# Patient Record
Sex: Male | Born: 1965 | Race: White | Hispanic: No | Marital: Single | Smoking: Never smoker
Health system: Southern US, Community
[De-identification: ages and names within clinical notes are randomized; demographics above are authoritative.]

## PROBLEM LIST (undated history)

## (undated) DIAGNOSIS — J189 Pneumonia, unspecified organism: Secondary | ICD-10-CM

## (undated) DIAGNOSIS — E7800 Pure hypercholesterolemia, unspecified: Secondary | ICD-10-CM

## (undated) DIAGNOSIS — U071 COVID-19: Secondary | ICD-10-CM

## (undated) HISTORY — PX: APPENDECTOMY: SHX54

## (undated) HISTORY — PX: KNEE ARTHROSCOPY: SHX127

---

## 1999-11-21 ENCOUNTER — Encounter: Payer: Self-pay | Admitting: Family Medicine

## 1999-11-21 ENCOUNTER — Encounter: Admission: RE | Admit: 1999-11-21 | Discharge: 1999-11-21 | Payer: Self-pay | Admitting: Family Medicine

## 2000-03-16 ENCOUNTER — Encounter: Admission: RE | Admit: 2000-03-16 | Discharge: 2000-03-16 | Payer: Self-pay | Admitting: Gastroenterology

## 2000-03-16 ENCOUNTER — Encounter: Payer: Self-pay | Admitting: Gastroenterology

## 2000-09-08 ENCOUNTER — Emergency Department (HOSPITAL_COMMUNITY): Admission: EM | Admit: 2000-09-08 | Discharge: 2000-09-09 | Payer: Self-pay | Admitting: Emergency Medicine

## 2000-09-09 ENCOUNTER — Encounter: Payer: Self-pay | Admitting: Emergency Medicine

## 2002-07-10 ENCOUNTER — Encounter: Payer: Self-pay | Admitting: Family Medicine

## 2002-07-10 ENCOUNTER — Ambulatory Visit (HOSPITAL_COMMUNITY): Admission: RE | Admit: 2002-07-10 | Discharge: 2002-07-11 | Payer: Self-pay | Admitting: Family Medicine

## 2002-07-10 ENCOUNTER — Encounter (INDEPENDENT_AMBULATORY_CARE_PROVIDER_SITE_OTHER): Payer: Self-pay | Admitting: Specialist

## 2002-11-07 ENCOUNTER — Encounter: Payer: Self-pay | Admitting: Emergency Medicine

## 2002-11-07 ENCOUNTER — Emergency Department (HOSPITAL_COMMUNITY): Admission: EM | Admit: 2002-11-07 | Discharge: 2002-11-07 | Payer: Self-pay | Admitting: Emergency Medicine

## 2006-07-26 ENCOUNTER — Ambulatory Visit: Payer: Self-pay | Admitting: Cardiology

## 2006-09-20 ENCOUNTER — Ambulatory Visit: Payer: Self-pay | Admitting: Cardiology

## 2010-04-24 ENCOUNTER — Emergency Department (HOSPITAL_BASED_OUTPATIENT_CLINIC_OR_DEPARTMENT_OTHER): Admission: EM | Admit: 2010-04-24 | Discharge: 2010-04-24 | Payer: Self-pay | Admitting: Emergency Medicine

## 2011-02-24 NOTE — Op Note (Signed)
NAME:  Marc Horton, Marc Horton                        ACCOUNT NO.:  [id number]   MEDICAL RECORD NO.:  [id number]                   PATIENT TYPE:  OIB   LOCATION:  3005                                 FACILITY:  [facility]   PHYSICIAN:  Ollen Gross. Vernell Morgans, M.D.              DATE OF BIRTH:  1966-06-18   DATE OF PROCEDURE:  07/10/2002  DATE OF DISCHARGE:  07/11/2002                                 OPERATIVE REPORT   PREOPERATIVE DIAGNOSIS:  Acute appendicitis.   POSTOPERATIVE DIAGNOSIS:  Acute appendicitis.   PROCEDURE:  Laparoscopic appendectomy.   SURGEON:  Ollen Gross. Carolynne Edouard, M.D.   ANESTHESIA:  General endotracheal.   DESCRIPTION OF PROCEDURE:  After informed consent was obtained, the patient  was brought to the operating room and placed in the supine position on the  operating table, where after adequate induction of general anesthesia the  patient's abdomen was prepped with Betadine and draped in the usual sterile  manner.  The area below the umbilicus was infiltrated with 0.25% Marcaine  and a small incision was made with a 15 blade knife.  This incision was  carried down through the subcutaneous tissue using blunt dissection with the  Army[employer]y retractors and a Kelly clamp until the linea alba was identified.  The linea alba was incised with a 15 blade knife and each side was grasped  with Kocher clamps and elevated anteriorly.  The preperitoneal space was  then probed bluntly with a hemostat until the peritoneum was opened and  access was gained to the abdominal cavity.  A 0 Vicryl pursestring stitch  was then placed in the fascia surrounding this opening.  A Hasson cannula  was then placed through this opening and anchored into place with the  previously-placed Vicryl pursestring stitch.  The abdomen was then  insufflated with carbon dioxide without difficulty and the laparoscope was  placed through the Hasson cannula.  The patient was placed in some slight  Trendelenburg and rotated a  little bit left side down.  The right lower  quadrant was able to be inspected and an inflamed appendix that appeared to  be spiraled was able to be identified.  Next an area in the suprapubic  region was chosen for a 12 mm port, and this area was infiltrated with 0.25%  Marcaine.  A small incision was made with a 15 blade knife and a 12 mm port  was placed bluntly through this incision into the abdominal cavity under  direct vision.  A site was then chosen just above the Hasson cannula and to  the right of midline for placement of a 5 mm port, and this area was  infiltrated with 0.25% Marcaine.  A small stab incision was made with a 15  blade knife and a 5 mm port was placed bluntly through this incision into  the abdominal cavity.  A blunt grasper was placed through the 12 mm port  and  used to elevate the appendix.  The mesoappendix was then able to be taken  down by a combination of some blunt dissection and division with the  electrocautery.  This was carried out until the appendix was freed up and  the base of the appendix at its junction with the cecum was able to be  identified.  Once this had been cleared of any of the mesoappendix by the  Harmonic scalpel, a Glassman retractor was used to grasp the tip of the  appendix and hold it up in the air.  An endoscopic GIA stapler was then used  to clamp the base of the appendix near its junction with the cecum.  The  endoscopic stapler was fired and the appendix was divided, the distal end  separated.  An endoscopic bag was placed in the abdomen and the appendix was  placed within the bag.  The bag was then removed through the infraumbilical  port with the Hasson cannula.  The Hasson cannula was then replaced and the  abdomen was then inspected.  The staple line looked good and was intact and  was completely hemostatic.  The abdomen was then irrigated with copious  amounts of saline until the effluent was clear.  The ports were then all   removed under direct vision.  The infraumbilical port was closed with the  previously-placed Vicryl pursestring stitch.  The suprapubic port site was  closed with a single interrupted 0 Vicryl stitch.  The skin incisions were  then  closed with interrupted 4-0 nylon subcuticular stitches, Benzoin and Steri-  Strips and sterile dressings were applied.  The patient tolerated the  procedure well.  At the end of the case all needle, sponge, and instrument  counts were correct.  The patient was awakened and taken to the recovery  room in stable condition.                                               Ollen Gross. Vernell Morgans, M.D.    PST/MEDQ  D:  07/21/2002  T:  07/22/2002  Job:  [id number]
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
## 2011-02-24 NOTE — Procedures (Signed)
[facility]                              EXERCISE TREADMILL   NAME:Marc Horton, Marc Horton                     MRN:          [id number]  DATE:09/20/2006                            DOB:          1966-02-10    INDICATIONS:  Evaluate the patient with multiple cardiovascular risk  factors.   PROCEDURE NOTE:  The patient was exercised using standard Bruce  protocol.  He was able to exercise for 9 minutes which completed stage  III.  He achieved 10.4 mets.  He achieved target heart rate with a peak  of 151 which was 89% of predicted.  The test was terminated because of  shortness of breath and because he had reached his target heart rate.  His maximum blood pressure was 189/79.  There were no ischemic ST-T wave  changes.  There were no arrhythmias.  He had a normal heart rate  recovery.  He had no chest pain.   Negative adequate exercise treadmill test.  He had a moderate exercise  tolerance.  This puts him in the low to moderate risk group for future  cardiovascular events.   PLAN:  Based on the above, I did give him a specific prescription for  exercise and we discussed at great length primary risk reduction.  There  is no evidence for a high-grade obstructive coronary disease.  He will  continue with the risk reduction given his family history and we will  follow closely with Dr. Yehuda Budd.     Rollene Rotunda, MD, [facility]  Electronically Signed    JH/MedQ  DD: 09/20/2006  DT: 09/20/2006  Job #: [id number]   cc:   Tammy R. Collins Scotland, M.D.

## 2011-02-24 NOTE — Letter (Signed)
September 20, 2006     RE:  [name], [name]  MRN:  [id number]  /  DOB:  07/27/1966   Herb Grays, MD  [address]  [place] [address]   Dear Babette Relic,   Thank you for sending [name] to my clinic. As you know this  gentleman has had a dramatic year with his brother having a sudden  myocardial infarction. I understand that he is home and recovering with  some short term memory deficits.  [name] did not have any red flags suggesting high grade obstructive  coronary disease. However, I agree with an aggressive approach for  primary risk reduction given the family history. In this situation I  prefer to do exercise treadmill test on these folks. While I do not  think it is a great screening test for obstructive coronary disease, I  think it is very reasonable when the pretest probability of disease is  low. More importantly I like to use the test to risk stratify patients  and most importantly to give them an exercise regimen. Today I did that  treadmill test. He had a reasonable exercise tolerance, though not  great. He had no evidence of high grade obstructive coronary disease.  Following his heart rate recovery after exercise, I am looking at his  exercise tolerance, I think he is in the low to low-moderate risk  category for future cardiovascular events. He needs no further  cardiovascular testing.  Based on this study I did give him very specific instructions on an  exercise regimen. I greatly agree with an aggressive approach to lipid  management, blood pressure control and weight  loss.  Again thank you for letting me participate in this gentleman's care.  Primary risk reduction is one of my favorite things to do. If you have  any questions about this gentleman please do not hesitate to call me at  [phone] on my cell.    Sincerely,      Rollene Rotunda, MD, [facility]  Electronically Signed    JH/MedQ  DD: 09/20/2006  DT: 09/20/2006  Job #: [id number]

## 2013-12-14 ENCOUNTER — Emergency Department (INDEPENDENT_AMBULATORY_CARE_PROVIDER_SITE_OTHER)
Admission: EM | Admit: 2013-12-14 | Discharge: 2013-12-14 | Disposition: A | Discharge status: Home / Self Care | Payer: PRIVATE HEALTH INSURANCE | Source: Home / Self Care | Attending: Family Medicine | Admitting: Family Medicine

## 2013-12-14 ENCOUNTER — Encounter: Payer: Self-pay | Admitting: Emergency Medicine

## 2013-12-14 DIAGNOSIS — J40 Bronchitis, not specified as acute or chronic: Secondary | ICD-10-CM

## 2013-12-14 DIAGNOSIS — R062 Wheezing: Secondary | ICD-10-CM

## 2013-12-14 DIAGNOSIS — J069 Acute upper respiratory infection, unspecified: Secondary | ICD-10-CM

## 2013-12-14 HISTORY — DX: Pneumonia, unspecified organism: J18.9

## 2013-12-14 MED ORDER — AZITHROMYCIN 250 MG PO TABS: ORAL_TABLET | ORAL | Status: DC

## 2013-12-14 MED ORDER — METHYLPREDNISOLONE ACETATE 80 MG/ML IJ SUSP
80.0000 mg | Freq: Once | INTRAMUSCULAR | Status: AC
  Administered 2013-12-14: 80 mg via INTRAMUSCULAR

## 2013-12-14 MED ORDER — BENZONATATE 100 MG PO CAPS: 100.0000 mg | ORAL_CAPSULE | Freq: Three times a day (TID) | ORAL | Status: DC | PRN

## 2013-12-14 NOTE — ED Notes (Signed)
Reports 2 1/2 week history of congestion and cough; no known fever; throat raw from cough; fatigue and general aches. History of pneumonia, so concerned this may be possibility. No OTCs today.

## 2013-12-14 NOTE — ED Provider Notes (Signed)
CSN: [id number]     Arrival date & time 12/14/13  1623 History   First MD Initiated Contact with Patient 12/14/13 1645     Chief Complaint  Patient presents with  . Cough  . Generalized Body Aches  . Sore Throat  . Nasal Congestion    HPI  URI Symptoms Onset: 2-3 weeks  Description: rhinorrhea, nasal congestion, cough  Modifying factors:  Remote hx/o PNA in the past. Minimal SOB.   Symptoms Nasal discharge: yes Fever: no Sore throat: no Cough: yes Wheezing: mild Ear pain: no GI symptoms: no Sick contacts: no  Red Flags  Stiff neck: no Dyspnea: minimal  Rash: no Swallowing difficulty: no  Sinusitis Risk Factors Headache/face pain: no Double sickening: no tooth pain: no  Allergy Risk Factors Sneezing: no Itchy scratchy throat: no Seasonal symptoms: no  Flu Risk Factors Headache: no muscle aches: no severe fatigue: no   Past Medical History  Diagnosis Date  . Pneumonia    Past Surgical History  Procedure Laterality Date  . Knee arthroscopy Right   . Appendectomy     Family History  Problem Relation Age of Onset  . Heart failure Mother   . Heart failure Father    History  Substance Use Topics  . Smoking status: Never Smoker   . Smokeless tobacco: Not on file  . Alcohol Use: No    Review of Systems  All other systems reviewed and are negative.    Allergies  Review of patient's allergies indicates no known allergies.  Home Medications   Current Outpatient Rx  Name  Route  Sig  Dispense  Refill  . azithromycin (ZITHROMAX) 250 MG tablet      Take 2 tabs PO x 1 dose, then 1 tab PO QD x 4 days   6 tablet   0   . benzonatate (TESSALON) 100 MG capsule   Oral   Take 1-2 capsules (100-200 mg total) by mouth 3 (three) times daily as needed for cough.   40 capsule   0    BP 134/73  Pulse 105  Temp(Src) 98.4 F (36.9 C) (Oral)  Resp 16  Ht 6' (1.829 m)  Wt 245 lb (111.131 kg)  BMI 33.22 kg/m2  SpO2 96% Physical Exam   Constitutional: He is oriented to person, place, and time. He appears well-developed and well-nourished.  HENT:  Head: Normocephalic and atraumatic.  Right Ear: External ear normal.  Left Ear: External ear normal.  +nasal erythema, rhinorrhea bilaterally, + post oropharyngeal erythema    Eyes: Conjunctivae are normal. Pupils are equal, round, and reactive to light.  Neck: Normal range of motion. Neck supple.  Cardiovascular: Normal rate and regular rhythm.   Pulmonary/Chest: Effort normal. He has wheezes.  Abdominal: Soft.  Musculoskeletal: Normal range of motion.  Neurological: He is alert and oriented to person, place, and time.  Skin: Skin is warm.    ED Course  Procedures (including critical care time) Labs Review Labs Reviewed - No data to display Imaging Review No results found.   MDM   1. URI (upper respiratory infection)   2. Bronchitis   3. Wheezing    Likely viral source or sxs  Will place on zpak for lower resp coverage given prior history. Reassuring resp exam today.  Depomedrol 80mg  IM x1 given mild wheezing Tessalon perles for cough  Discussed supportive care and infectious/resp red flags.  Follow up as needed.    The patient and/or caregiver has been counseled thoroughly with  regard to treatment plan and/or medications prescribed including dosage, schedule, interactions, rationale for use, and possible side effects and they verbalize understanding. Diagnoses and expected course of recovery discussed and will return if not improved as expected or if the condition worsens. Patient and/or caregiver verbalized understanding.         Doree Albee, MD 12/14/13 [phone]

## 2015-06-06 ENCOUNTER — Emergency Department (INDEPENDENT_AMBULATORY_CARE_PROVIDER_SITE_OTHER)
Admission: EM | Admit: 2015-06-06 | Discharge: 2015-06-06 | Disposition: A | Discharge status: Home / Self Care | Payer: PRIVATE HEALTH INSURANCE | Source: Home / Self Care | Attending: Family Medicine | Admitting: Family Medicine

## 2015-06-06 DIAGNOSIS — H01006 Unspecified blepharitis left eye, unspecified eyelid: Secondary | ICD-10-CM

## 2015-06-06 DIAGNOSIS — H01003 Unspecified blepharitis right eye, unspecified eyelid: Secondary | ICD-10-CM | POA: Diagnosis not present

## 2015-06-06 HISTORY — DX: Pure hypercholesterolemia, unspecified: E78.00

## 2015-06-06 MED ORDER — ERYTHROMYCIN 5 MG/GM OP OINT: TOPICAL_OINTMENT | OPHTHALMIC | Status: DC

## 2015-06-06 NOTE — ED Notes (Signed)
Pt states that for 3-4 weeks his eye has been crusty when waking, and redness intermittently during the day.  Has been treating with OTC pink eye medication, but not relieving symptoms

## 2015-06-06 NOTE — ED Provider Notes (Signed)
CSN: [id number]     Arrival date & time 06/06/15  1312 History   First MD Initiated Contact with Patient 06/06/15 1404     Chief Complaint  Patient presents with  . Eye Problem    Right      HPI Comments: Patient complains of 3 week history of eye redness each morning primarily in his right eye, and to a mild extent in his left eye.  His eyes crusted with discharge each morning and symptoms improve during the day.  No foreign body.  No change in vision.  His condition has not improved with OTC "pink eye medicine."  Patient is a 49 y.o. male presenting with eye problem. The history is provided by the patient.  Eye Problem Location:  Both Quality: mild itching. Severity:  Mild Onset quality:  Gradual Duration:  3 weeks Timing:  Constant Progression:  Unchanged Chronicity:  New Context: not chemical exposure, not contact lens problem, not direct trauma, not foreign body and not scratch   Relieved by:  Nothing Worsened by:  Nothing tried Ineffective treatments:  Eye drops Associated symptoms: crusting, discharge, inflammation, itching, redness and tearing   Associated symptoms: no blurred vision, no decreased vision, no double vision, no facial rash, no foreign body sensation, no headaches, no photophobia, no scotomas, no swelling and no tingling   Risk factors: no recent URI     Past Medical History  Diagnosis Date  . Hypercholesteremia    History reviewed. No pertinent past surgical history. Family History  Problem Relation Age of Onset  . Diabetes Mother   . Heart disease Mother   . Heart disease Father    Social History  Substance Use Topics  . Smoking status: Never Smoker   . Smokeless tobacco: Never Used  . Alcohol Use: No    Review of Systems  Eyes: Positive for discharge, redness and itching. Negative for blurred vision, double vision and photophobia.  Neurological: Negative for tingling and headaches.    Allergies  Review of patient's allergies indicates no  known allergies.  Home Medications   Prior to Admission medications   Not on File   Meds Ordered and Administered this Visit  Medications - No data to display  BP 137/80 mmHg  Pulse 76  Temp(Src) 98.4 F (36.9 C) (Oral)  Ht 6' (1.829 m)  Wt 260 lb (117.935 kg)  BMI 35.25 kg/m2  SpO2 94% No data found.   Physical Exam  Constitutional: He appears well-developed and well-nourished. No distress.  Patient is obese (BMI 35.3)  HENT:  Head: Normocephalic.  Right Ear: Tympanic membrane, external ear and ear canal normal.  Left Ear: Tympanic membrane, external ear and ear canal normal.  Nose: Nose normal.  Mouth/Throat: Oropharynx is clear and moist.  Eyes: Conjunctivae and EOM are normal. Pupils are equal, round, and reactive to light. Lids are everted and swept, no foreign bodies found. Right eye exhibits chemosis. Right eye exhibits no discharge, no exudate and no hordeolum. No foreign body present in the right eye. Left eye exhibits no chemosis, no discharge, no exudate and no hordeolum. No foreign body present in the left eye.    Right upper eyelid is slightly erythematous along interior edge.  Minimal swelling and tenderness to palpation upper eyelid. Left upper eyelid has mild erythema along interior edge without swelling or tenderness of eyelid.  Neck: Neck supple.  Lymphadenopathy:    He has no cervical adenopathy.  Skin: Skin is warm and dry. No rash noted.  Nursing note and vitals reviewed.   ED Course  Procedures  None    MDM   1. Blepharitis of both eyes (posterior type)   Begin Erythromycin ophthalmic ointment to eyelid margins TID Begin applying a warm compress to eyes for about 10 minutes, 3 to 4 times daily.  Treatment instructions given to patient. Followup with ophthalmologist if not improving about 3 weeks.  Instructions given.    Lattie Haw, MD 06/08/15 [phone]

## 2015-06-06 NOTE — Discharge Instructions (Signed)
Begin applying a warm compress to eyes for about 10 minutes, 3 to 4 times daily.   Blepharitis Blepharitis is redness, soreness, and swelling (inflammation) of one or both eyelids. It may be caused by an allergic reaction or a bacterial infection. Blepharitis may also be associated with reddened, scaly skin (seborrhea) of the scalp and eyebrows. While you sleep, eye discharge may cause your eyelashes to stick together. Your eyelids may itch, burn, swell, and may lose their lashes. These will grow back. Your eyes may become sensitive. Blepharitis may recur and need repeated treatment. If this is the case, you may require further evaluation by an eye specialist (ophthalmologist). HOME CARE INSTRUCTIONS   Keep your hands clean.  Use a clean towel each time you dry your eyelids. Do not use this towel to clean other areas. Do not share a towel or makeup with anyone.  Wash your eyelids with warm water or warm water mixed with a small amount of baby shampoo. Do this twice a day or as often as needed.  Wash your face and eyebrows at least once a day.  Use warm compresses 2 times a day for 10 minutes at a time, or as directed by your caregiver.  Apply antibiotic ointment as directed by your caregiver.  Avoid rubbing your eyes.  Avoid wearing makeup until you get better.  Follow up with your caregiver as directed. SEEK IMMEDIATE MEDICAL CARE IF:   You have pain, redness, or swelling that gets worse or spreads to other parts of your face.  Your vision changes, or you have pain when looking at lights or moving objects.  You have a fever.  Your symptoms continue for longer than 2 to 4 days or become worse. MAKE SURE YOU:   Understand these instructions.  Will watch your condition.  Will get help right away if you are not doing well or get worse. Document Released: 09/22/2000 Document Revised: 12/18/2011 Document Reviewed: 11/02/2010 [facility] Patient Information 2015 [place], [place]. This  information is not intended to replace advice given to you by your health care provider. Make sure you discuss any questions you have with your health care provider.

## 2015-11-11 ENCOUNTER — Encounter: Payer: Self-pay | Admitting: Family Medicine

## 2015-11-11 ENCOUNTER — Ambulatory Visit (INDEPENDENT_AMBULATORY_CARE_PROVIDER_SITE_OTHER): Payer: PRIVATE HEALTH INSURANCE

## 2015-11-11 ENCOUNTER — Ambulatory Visit (INDEPENDENT_AMBULATORY_CARE_PROVIDER_SITE_OTHER): Payer: PRIVATE HEALTH INSURANCE | Admitting: Family Medicine

## 2015-11-11 VITALS — BP 136/82 | HR 96 | Ht 72.0 in | Wt 271.0 lb

## 2015-11-11 DIAGNOSIS — E669 Obesity, unspecified: Secondary | ICD-10-CM

## 2015-11-11 DIAGNOSIS — R059 Cough, unspecified: Secondary | ICD-10-CM

## 2015-11-11 DIAGNOSIS — R030 Elevated blood-pressure reading, without diagnosis of hypertension: Secondary | ICD-10-CM | POA: Diagnosis not present

## 2015-11-11 DIAGNOSIS — R05 Cough: Secondary | ICD-10-CM | POA: Diagnosis not present

## 2015-11-11 DIAGNOSIS — R000 Tachycardia, unspecified: Secondary | ICD-10-CM | POA: Diagnosis not present

## 2015-11-11 DIAGNOSIS — R058 Other specified cough: Secondary | ICD-10-CM | POA: Insufficient documentation

## 2015-11-11 DIAGNOSIS — Z1159 Encounter for screening for other viral diseases: Secondary | ICD-10-CM

## 2015-11-11 DIAGNOSIS — I10 Essential (primary) hypertension: Secondary | ICD-10-CM | POA: Insufficient documentation

## 2015-11-11 DIAGNOSIS — Z114 Encounter for screening for human immunodeficiency virus [HIV]: Secondary | ICD-10-CM

## 2015-11-11 DIAGNOSIS — R6882 Decreased libido: Secondary | ICD-10-CM | POA: Insufficient documentation

## 2015-11-11 DIAGNOSIS — IMO0001 Reserved for inherently not codable concepts without codable children: Secondary | ICD-10-CM

## 2015-11-11 MED ORDER — ALBUTEROL SULFATE 108 (90 BASE) MCG/ACT IN AEPB: 1.0000 | INHALATION_SPRAY | Freq: Four times a day (QID) | RESPIRATORY_TRACT | Status: DC | PRN

## 2015-11-11 MED ORDER — IPRATROPIUM BROMIDE 0.06 % NA SOLN: 2.0000 | Freq: Four times a day (QID) | NASAL | Status: DC

## 2015-11-11 MED ORDER — GUAIFENESIN-CODEINE 100-10 MG/5ML PO SOLN: 5.0000 mL | Freq: Every evening | ORAL | Status: DC | PRN

## 2015-11-11 MED ORDER — AZITHROMYCIN 250 MG PO TABS: 250.0000 mg | ORAL_TABLET | Freq: Every day | ORAL | Status: DC

## 2015-11-11 MED ORDER — BENZONATATE 200 MG PO CAPS: 200.0000 mg | ORAL_CAPSULE | Freq: Three times a day (TID) | ORAL | Status: DC | PRN

## 2015-11-11 MED ORDER — IPRATROPIUM-ALBUTEROL 0.5-2.5 (3) MG/3ML IN SOLN
3.0000 mL | Freq: Once | RESPIRATORY_TRACT | Status: AC
  Administered 2015-11-11: 3 mL via RESPIRATORY_TRACT

## 2015-11-11 NOTE — Assessment & Plan Note (Signed)
Mildly elevated. Recheck in the next visit.

## 2015-11-11 NOTE — Assessment & Plan Note (Signed)
Mildly elevated likely due to over-the-counter cold medicines or possible pneumonia. Check chest x-ray. Recheck in one month. He's

## 2015-11-11 NOTE — Patient Instructions (Signed)
Thank you for coming in today. 1) Take the medicine for cough.  Get xray.  2) Return in 1 month to recheck blood pressure.  3) Get fasting morning labs soon.   Acute Bronchitis Bronchitis is inflammation of the airways that extend from the windpipe into the lungs (bronchi). The inflammation often causes mucus to develop. This leads to a cough, which is the most common symptom of bronchitis.  In acute bronchitis, the condition usually develops suddenly and goes away over time, usually in a couple weeks. Smoking, allergies, and asthma can make bronchitis worse. Repeated episodes of bronchitis may cause further lung problems.  CAUSES Acute bronchitis is most often caused by the same virus that causes a cold. The virus can spread from person to person (contagious) through coughing, sneezing, and touching contaminated objects. SIGNS AND SYMPTOMS   Cough.   Fever.   Coughing up mucus.   Body aches.   Chest congestion.   Chills.   Shortness of breath.   Sore throat.  DIAGNOSIS  Acute bronchitis is usually diagnosed through a physical exam. Your health care provider will also ask you questions about your medical history. Tests, such as chest X-rays, are sometimes done to rule out other conditions.  TREATMENT  Acute bronchitis usually goes away in a couple weeks. Oftentimes, no medical treatment is necessary. Medicines are sometimes given for relief of fever or cough. Antibiotic medicines are usually not needed but may be prescribed in certain situations. In some cases, an inhaler may be recommended to help reduce shortness of breath and control the cough. A cool mist vaporizer may also be used to help thin bronchial secretions and make it easier to clear the chest.  HOME CARE INSTRUCTIONS  Get plenty of rest.   Drink enough fluids to keep your urine clear or pale yellow (unless you have a medical condition that requires fluid restriction). Increasing fluids may help thin your  respiratory secretions (sputum) and reduce chest congestion, and it will prevent dehydration.   Take medicines only as directed by your health care provider.  If you were prescribed an antibiotic medicine, finish it all even if you start to feel better.  Avoid smoking and secondhand smoke. Exposure to cigarette smoke or irritating chemicals will make bronchitis worse. If you are a smoker, consider using nicotine gum or skin patches to help control withdrawal symptoms. Quitting smoking will help your lungs heal faster.   Reduce the chances of another bout of acute bronchitis by washing your hands frequently, avoiding people with cold symptoms, and trying not to touch your hands to your mouth, nose, or eyes.   Keep all follow-up visits as directed by your health care provider.  SEEK MEDICAL CARE IF: Your symptoms do not improve after 1 week of treatment.  SEEK IMMEDIATE MEDICAL CARE IF:  You develop an increased fever or chills.   You have chest pain.   You have severe shortness of breath.  You have bloody sputum.   You develop dehydration.  You faint or repeatedly feel like you are going to pass out.  You develop repeated vomiting.  You develop a severe headache. MAKE SURE YOU:   Understand these instructions.  Will watch your condition.  Will get help right away if you are not doing well or get worse.   This information is not intended to replace advice given to you by your health care provider. Make sure you discuss any questions you have with your health care provider.   Document  Released: 11/02/2004 Document Revised: 10/16/2014 Document Reviewed: 03/18/2013 Elsevier Interactive Patient Education [employer].

## 2015-11-11 NOTE — Assessment & Plan Note (Signed)
Likely bronchitis. Treat with prednisone, azithromycin, codeine cough syrup, Tessalon, Atrovent nasal spray, and albuterol. Recheck sooner if not better. Obtain chest x-ray for concern for pneumonia based on elevated heart rate.

## 2015-11-11 NOTE — Progress Notes (Signed)
       Marc Horton is a 50 y.o. male who presents to [facility]: Primary Care today for status care discussed cough congestion and runny nose. Cough is mildly productive. Patient denies significant wheezing or shortness of breath. No chest pain palpitations fevers or chills. Son has a recent illness. He patient has a history of pneumonia in the past. He's tried over-the-counter medicines which have helped.  Additionally patient notes some overall underlying health concerns such as obesity and low libido. He would like to return for further discussion of these problems in the future.   Past Medical History  Diagnosis Date  . Pneumonia    Past Surgical History  Procedure Laterality Date  . Knee arthroscopy Right   . Appendectomy     Social History  Substance Use Topics  . Smoking status: Never Smoker   . Smokeless tobacco: Not on file  . Alcohol Use: No   family history includes Heart failure in his father and mother.  ROS as above: No vomiting diarrhea chest pain palpitations or loss labs feeling too cold feeling too hot skin changes. Medications: Current Outpatient Prescriptions  Medication Sig Dispense Refill  . Albuterol Sulfate (PROAIR RESPICLICK) 108 (90 Base) MCG/ACT AEPB Inhale 1 puff into the lungs every 6 (six) hours as needed (wheeze cough). 1 each 1  . azithromycin (ZITHROMAX) 250 MG tablet Take 1 tablet (250 mg total) by mouth daily. Take first 2 tablets together, then 1 every day until finished. 6 tablet 0  . benzonatate (TESSALON) 200 MG capsule Take 1 capsule (200 mg total) by mouth 3 (three) times daily as needed for cough. 45 capsule 0  . guaiFENesin-codeine 100-10 MG/5ML syrup Take 5 mLs by mouth at bedtime as needed for cough. 120 mL 0  . ipratropium (ATROVENT) 0.06 % nasal spray Place 2 sprays into both nostrils 4 (four) times daily. 15 mL 1   No current  facility-administered medications for this visit.   No Known Allergies   Exam:  BP 136/82 mmHg  Pulse 96  Ht 6' (1.829 m)  Wt 271 lb (122.925 kg)  BMI 36.75 kg/m2 Gen: Well NAD obese HEENT: EOMI,  MMM posterior pharynx with cobblestoning. Clear nasal discharge. No significant cervical lymphadenopathy Lungs: Normal work of breathing. Coarse breath sounds bilaterally. Heart: Mildly tachycardia at a rate of 105 bpm but regular no MRG Abd: NABS, Soft. Nondistended, Nontender Exts: Brisk capillary refill, warm and well perfused.  Psych: Alert and oriented normal speech thought process and affect.  Patient was given a 2.5/0.5 mg DuoNeb nebulizer treatment and improved.  Chest x-ray pending.  No results found for this or any previous visit (from the past 24 hour(s)). No results found.   Please see individual assessment and plan sections.

## 2015-11-11 NOTE — Assessment & Plan Note (Signed)
Check testosterone. 

## 2015-11-11 NOTE — Progress Notes (Signed)
Quick Note:  CXR was normal. ______

## 2015-11-11 NOTE — Assessment & Plan Note (Signed)
Obtain fasting labs. We'll discuss at the next visit.

## 2015-12-15 ENCOUNTER — Telehealth: Payer: Self-pay | Admitting: Family Medicine

## 2015-12-15 MED ORDER — ZOSTER VACCINE LIVE 19400 UNT/0.65ML ~~LOC~~ SOLR: 0.6500 mL | Freq: Once | SUBCUTANEOUS | Status: DC

## 2015-12-15 NOTE — Telephone Encounter (Signed)
PT wife dx shingles. Pt would like zoster vaccine

## 2016-03-27 ENCOUNTER — Encounter: Payer: Self-pay | Admitting: Family Medicine

## 2016-06-01 ENCOUNTER — Encounter: Payer: Self-pay | Admitting: Family Medicine

## 2016-11-22 ENCOUNTER — Ambulatory Visit (INDEPENDENT_AMBULATORY_CARE_PROVIDER_SITE_OTHER): Payer: PRIVATE HEALTH INSURANCE

## 2016-11-22 ENCOUNTER — Ambulatory Visit (INDEPENDENT_AMBULATORY_CARE_PROVIDER_SITE_OTHER): Payer: PRIVATE HEALTH INSURANCE | Admitting: Family Medicine

## 2016-11-22 VITALS — BP 138/90 | HR 74 | Wt 264.0 lb

## 2016-11-22 DIAGNOSIS — M172 Bilateral post-traumatic osteoarthritis of knee: Secondary | ICD-10-CM | POA: Diagnosis not present

## 2016-11-22 DIAGNOSIS — G8929 Other chronic pain: Secondary | ICD-10-CM

## 2016-11-22 DIAGNOSIS — M1710 Unilateral primary osteoarthritis, unspecified knee: Secondary | ICD-10-CM | POA: Insufficient documentation

## 2016-11-22 DIAGNOSIS — M25561 Pain in right knee: Principal | ICD-10-CM

## 2016-11-22 DIAGNOSIS — M25562 Pain in left knee: Secondary | ICD-10-CM | POA: Diagnosis not present

## 2016-11-22 DIAGNOSIS — M179 Osteoarthritis of knee, unspecified: Secondary | ICD-10-CM | POA: Insufficient documentation

## 2016-11-22 DIAGNOSIS — M170 Bilateral primary osteoarthritis of knee: Secondary | ICD-10-CM | POA: Diagnosis not present

## 2016-11-22 MED ORDER — DICLOFENAC SODIUM 1 % TD GEL: 4.0000 g | Freq: Four times a day (QID) | TRANSDERMAL | 11 refills | Status: DC

## 2016-11-22 NOTE — Patient Instructions (Signed)
Thank you for coming in today. Call or go to the ER if you develop a large red swollen joint with extreme pain or oozing puss.  Work on exercise bike, and weight loss.  Return as needed.  Use voltaren gel up to 4 x daily.    Arthritis Introduction Arthritis is a term that is commonly used to refer to joint pain or joint disease. There are more than 100 types of arthritis. What are the causes? The most common cause of this condition is wear and tear of a joint. Other causes include:  Gout.  Inflammation of a joint.  An infection of a joint.  Sprains and other injuries near the joint.  A drug reaction or allergic reaction. In some cases, the cause may not be known. What are the signs or symptoms? The main symptom of this condition is pain in the joint with movement. Other symptoms include:  Redness, swelling, or stiffness at a joint.  Warmth coming from the joint.  Fever.  Overall feeling of illness. How is this diagnosed? This condition may be diagnosed with a physical exam and tests, including:  Blood tests.  Urine tests.  Imaging tests, such as MRI, X-rays, or a CT scan. Sometimes, fluid is removed from a joint for testing. How is this treated? Treatment for this condition may involve:  Treatment of the cause, if it is known.  Rest.  Raising (elevating) the joint.  Applying cold or hot packs to the joint.  Medicines to improve symptoms and reduce inflammation.  Injections of a steroid such as cortisone into the joint to help reduce pain and inflammation. Depending on the cause of your arthritis, you may need to make lifestyle changes to reduce stress on your joint. These changes may include exercising more and losing weight. Follow these instructions at home: Medicines  Take over-the-counter and prescription medicines only as told by your health care provider.  Do not take aspirin to relieve pain if gout is suspected. Activity  Rest your joint if told  by your health care provider. Rest is important when your disease is active and your joint feels painful, swollen, or stiff.  Avoid activities that make the pain worse. It is important to balance activity with rest.  Exercise your joint regularly with range-of-motion exercises as told by your health care provider. Try doing low-impact exercise, such as:  Swimming.  Water aerobics.  Biking.  Walking. Joint Care   If your joint is swollen, keep it elevated if told by your health care provider.  If your joint feels stiff in the morning, try taking a warm shower.  If directed, apply heat to the joint. If you have diabetes, do not apply heat without permission from your health care provider.  Put a towel between the joint and the hot pack or heating pad.  Leave the heat on the area for 20-30 minutes.  If directed, apply ice to the joint:  Put ice in a plastic bag.  Place a towel between your skin and the bag.  Leave the ice on for 20 minutes, 2-3 times per day.  Keep all follow-up visits as told by your health care provider. This is important. Contact a health care provider if:  The pain gets worse.  You have a fever. Get help right away if:  You develop severe joint pain, swelling, or redness.  Many joints become painful and swollen.  You develop severe back pain.  You develop severe weakness in your leg.  You  cannot control your bladder or bowels. This information is not intended to replace advice given to you by your health care provider. Make sure you discuss any questions you have with your health care provider. Document Released: 11/02/2004 Document Revised: 03/02/2016 Document Reviewed: 12/21/2014  2017 Elsevier

## 2016-11-22 NOTE — Progress Notes (Signed)
Marc Horton is a 51 y.o. male who presents to [facility] today for knee pain.  He has a history of bilateral knee arthritis for about 20 years that has worsened in the past few months, right worse than left. He has bilateral knee pain that is worse when he stands up from a seated position or goes down stairs. He can hear his right knee popping frequently but denies any mechanical catching or locking. Has taken some ibuprofen and Tylenol intermittently with no relief. Never tried physical therapy, injections, or other medications. 20 years ago, he injured his right knee twisting it at work, had an arthroscopy, and was told that he would need knee replacements before age 3. He would like to delay surgical intervention if possible.  Past Medical History:  Diagnosis Date  . Hypercholesteremia   . Pneumonia    Past Surgical History:  Procedure Laterality Date  . APPENDECTOMY    . KNEE ARTHROSCOPY Right    Social History  Substance Use Topics  . Smoking status: Never Smoker  . Smokeless tobacco: Not on file  . Alcohol use No     ROS:  As above   Medications: No current outpatient prescriptions on file.   No current facility-administered medications for this visit.    No Known Allergies   Exam:  BP 138/90   Pulse 74   Wt 264 lb (119.7 kg)   BMI 35.80 kg/m  General: Well Developed, well nourished, and in no acute distress.  Neuro/Psych: Alert and oriented x3, extra-ocular muscles intact, able to move all 4 extremities, sensation grossly intact. Skin: Warm and dry, no rashes noted.  Respiratory: Not using accessory muscles, speaking in full sentences, trachea midline.  Cardiovascular: Pulses palpable, no extremity edema. Abdomen: Does not appear distended. MSK: Bilateral knees diffusely tender to palpation (R>L) with crepitus on extension. No ligamentous instability and negative McMurray's tests bilaterally. ROM markedly  limited in both knees (worse on right).  X-ray right knee 11-22-16: IMPRESSION: Severe tricompartment degenerative change. Loose bodies may be present. Small knee joint effusion cannot be excluded. No acute bony abnormality identified.  X-ray left knee 11-22-16: IMPRESSION: Severe tricompartment degenerative change with loose bodies. Small knee joint effusion. No acute bony abnormality.   Procedure: Real-time Ultrasound Guided Injection of right knee  Device: GE Logiq E  Images permanently stored and available for review in the ultrasound unit. Verbal informed consent obtained. Discussed risks and benefits of procedure. Warned about infection bleeding damage to structures skin hypopigmentation and fat atrophy among others. Patient expresses understanding and agreement Time-out conducted.  Noted no overlying erythema, induration, or other signs of local infection.  Skin prepped in a sterile fashion.  Local anesthesia: Topical Ethyl chloride.  With sterile technique and under real time ultrasound guidance: 80 mg of Kenalog and 4 mL of Marcaine injected easily.  Completed without difficulty  Pain immediately resolved suggesting accurate placement of the medication.  Advised to call if fevers/chills, erythema, induration, drainage, or persistent bleeding.  Images permanently stored and available for review in the ultrasound unit.  Impression: Technically successful ultrasound guided injection.  Procedure: Real-time Ultrasound Guided Injection of left knee  Device: GE Logiq E  Images permanently stored and available for review in the ultrasound unit. Verbal informed consent obtained. Discussed risks and benefits of procedure. Warned about infection bleeding damage to structures skin hypopigmentation and fat atrophy among others. Patient expresses understanding and agreement Time-out conducted.  Noted no overlying erythema,  induration, or other signs of local infection.   Skin prepped in a sterile fashion.  Local anesthesia: Topical Ethyl chloride.  With sterile technique and under real time ultrasound guidance: 80 mg of Kenalog and 4 mL of Marcaine injected easily.  Completed without difficulty  Pain immediately resolved suggesting accurate placement of the medication.  Advised to call if fevers/chills, erythema, induration, drainage, or persistent bleeding.  Images permanently stored and available for review in the ultrasound unit.  Impression: Technically successful ultrasound guided injection.    No results found for this or any previous visit (from the past 48 hour(s)). No results found.  Assessment and Plan: 51 y.o. male with bilateral knee arthritis. Bilateral steroid injections today. Advised weight loss & quad strengthening (exercise bike), voltaren gel. F/u as needed.   Orders Placed This Encounter  Procedures  . DG Knee Complete 4 Views Left    Please include patellar sunrise, lateral, and weightbearing bilateral AP and bilateral rosenberg views    Standing Status:   Future    Number of Occurrences:   1    Standing Expiration Date:   01/22/2018    Order Specific Question:   Reason for exam:    Answer:   Please include patellar sunrise, lateral, and weightbearing bilateral AP and bilateral rosenberg views    Comments:   Please include patellar sunrise, lateral, and weightbearing bilateral AP and bilateral rosenberg views    Order Specific Question:   Preferred imaging location?    Answer:   [facility]  . DG Knee Complete 4 Views Right    Please include patellar sunrise, lateral, and weightbearing bilateral AP and bilateral rosenberg views    Standing Status:   Future    Number of Occurrences:   1    Standing Expiration Date:   01/22/2018    Order Specific Question:   Reason for exam:    Answer:   Please include patellar sunrise, lateral, and weightbearing bilateral AP and bilateral rosenberg views    Comments:    Please include patellar sunrise, lateral, and weightbearing bilateral AP and bilateral rosenberg views    Order Specific Question:   Preferred imaging location?    Answer:   [facility]    Discussed warning signs or symptoms. Please see discharge instructions. Patient expresses understanding.

## 2017-02-22 ENCOUNTER — Ambulatory Visit (INDEPENDENT_AMBULATORY_CARE_PROVIDER_SITE_OTHER): Payer: PRIVATE HEALTH INSURANCE | Admitting: Family Medicine

## 2017-02-22 VITALS — BP 149/77 | HR 78 | Temp 98.3°F | Wt 261.0 lb

## 2017-02-22 DIAGNOSIS — M533 Sacrococcygeal disorders, not elsewhere classified: Secondary | ICD-10-CM

## 2017-02-22 MED ORDER — DOXYCYCLINE HYCLATE 100 MG PO TABS: 100.0000 mg | ORAL_TABLET | Freq: Two times a day (BID) | ORAL | 0 refills | Status: DC

## 2017-02-22 NOTE — Progress Notes (Signed)
Pt has a cyst on right buttock.  Stated that he noticed it about 5 days ago. According to pt it is hard, denied drainage, redness or heat.

## 2017-02-22 NOTE — Patient Instructions (Signed)
Thank you for coming in today. We will try doxycycline.  Try to get the weight off the bottom especially with driving.  Use a cushion with a button in the middle or a doughnut cushion.  Recheck if not better.    Tailbone Injury The tailbone is the small bone at the lower end of the backbone (spine). You may have stretched tissues, bruises, or a broken bone (fracture). These injuries can be painful. Most tailbone injuries get better on their own in 4-6 weeks. Follow these instructions at home:  Take medicines only as told by your doctor.  If told, apply ice to the injured area.  Put ice in a plastic bag.  Place a towel between your skin and the bag.  Leave the ice on for 20 minutes, 2-3 times per day. Do this for the first 1-2 days.  Sit on a large, rubber or inflated ring or cushion to lessen pain. Lean forward when you sit to help lessen pain.  Avoid sitting in one place for a long time.  Increase your activity as the pain allows.  Do exercises as told by your doctor or physical therapist.  If it is painful to poop, take medicine to help you poop (stool softeners) as told by your doctor.  Eat foods that have plenty of fiber.  Keep all follow-up visits as told by your doctor. This is important. Contact a doctor if:  Your pain gets worse.  Pooping causes you pain.  You cannot poop (constipation).  You are leaking pee (urinary incontinence).  You have a fever. This information is not intended to replace advice given to you by your health care provider. Make sure you discuss any questions you have with your health care provider. Document Released: 10/28/2010 Document Revised: 05/25/2016 Document Reviewed: 09/21/2014 Elsevier Interactive Patient Education  2017 [employer]   Pilonidal Cyst A pilonidal cyst is a fluid-filled sac. It forms beneath the skin near your tailbone, at the top of the crease of your buttocks. A pilonidal cyst that is not large or infected may  not cause symptoms or problems. If the cyst becomes irritated or infected, it may fill with pus. This causes pain and swelling (pilonidal abscess). An infected cyst may need to be treated with medicine, drained, or removed. What are the causes? The cause of a pilonidal cyst is not known. One cause may be a hair that grows into your skin (ingrown hair). What increases the risk? Pilonidal cysts are more common in boys and men. Risk factors include:  Having lots of hair near the crease of the buttocks.  Being overweight.  Having a pilonidal dimple.  Wearing tight clothing.  Not bathing or showering frequently.  Sitting for long periods of time. What are the signs or symptoms? Signs and symptoms of a pilonidal cyst may include:  Redness.  Pain and tenderness.  Warmth.  Swelling.  Pus.  Fever. How is this diagnosed? Your health care provider may diagnose a pilonidal cyst based on your symptoms and a physical exam. The health care provider may do a blood test to check for infection. If your cyst is draining pus, your health care provider may take a sample of the drainage to be tested at a laboratory. How is this treated? Surgery is the usual treatment for an infected pilonidal cyst. You may also have to take medicines before surgery. The type of surgery you have depends on the size and severity of the infected cyst. The different kinds of surgery  include:  Incision and drainage. This is a procedure to open and drain the cyst.  Marsupialization. In this procedure, a large cyst or abscess may be opened and kept open by stitching the edges of the skin to the cyst walls.  Cyst removal. This procedure involves opening the skin and removing all or part of the cyst. Follow these instructions at home:  Follow all of your surgeon's instructions carefully if you had surgery.  Take medicines only as directed by your health care provider.  If you were prescribed an antibiotic medicine,  finish it all even if you start to feel better.  Keep the area around your pilonidal cyst clean and dry.  Clean the area as directed by your health care provider. Pat the area dry with a clean towel. Do not rub it as this may cause bleeding.  Remove hair from the area around the cyst as directed by your health care provider.  Do not wear tight clothing or sit in one place for long periods of time.  There are many different ways to close and cover an incision, including stitches, skin glue, and adhesive strips. Follow your health care provider's instructions on:  Incision care.  Bandage (dressing) changes and removal.  Incision closure removal. Contact a health care provider if:  You have drainage, redness, swelling, or pain at the site of the cyst.  You have a fever. This information is not intended to replace advice given to you by your health care provider. Make sure you discuss any questions you have with your health care provider. Document Released: 09/22/2000 Document Revised: 03/02/2016 Document Reviewed: 02/12/2014 Elsevier Interactive Patient Education  2017 [employer].

## 2017-02-22 NOTE — Progress Notes (Signed)
       Lacie Scottsimothy J Gander is a 51 y.o. male who [facility] today for buttocks pain. Patient has a small area at the central portion of his gluteal cleft near his anus that is tender. This is been present for about 5 days. He denies any drainage fevers or chills. He notes it's somewhat painful when he sets. He denies any injury or falls fevers chills nausea vomiting or diarrhea. He's tried some over-the-counter topical medicines which have not helped.   Past Medical History:  Diagnosis Date  . Hypercholesteremia   . Pneumonia    Past Surgical History:  Procedure Laterality Date  . APPENDECTOMY    . KNEE ARTHROSCOPY Right    Social History  Substance Use Topics  . Smoking status: Never Smoker  . Smokeless tobacco: Not on file  . Alcohol use No   family history includes Diabetes in his mother; Heart disease in his father and mother; Heart failure in his father and mother.  ROS as above:  Medications: Current Outpatient Prescriptions  Medication Sig Dispense Refill  . diclofenac sodium (VOLTAREN) 1 % GEL Apply 4 g topically 4 (four) times daily. To affected joint. 100 g 11  . doxycycline (VIBRA-TABS) 100 MG tablet Take 1 tablet (100 mg total) by mouth 2 (two) times daily. 14 tablet 0   No current facility-administered medications for this visit.    No Known Allergies  Health Maintenance Health Maintenance  Topic Date Due  . Hepatitis C Screening  14-Oct-1965  . HIV Screening  10/24/1978  . COLONOSCOPY  10/24/2013  . INFLUENZA VACCINE  05/09/2017  . TETANUS/TDAP  08/09/2020     Exam:  BP (!) 149/77 (BP Location: Left Arm, Patient Position: Sitting, Cuff Size: Normal)   Pulse 78   Temp 98.3 F (36.8 C) (Oral)   Wt 261 lb (118.4 kg)   SpO2 99%   BMI 35.40 kg/m  Gen: Well NAD HEENT: EOMI,  MMM Lungs: Normal work of breathing. CTABL Heart: RRR  no MRG Abd: NABS, Soft. Nondistended, Nontender Exts: Brisk capillary refill, warm and well perfused.  Skin gluteal cleft no visible erythema or induration. Patient is minimally tender overlying the coccyx. Sternal anus is normal-appearing with no fistula tracts are visible   No results found for this or any previous visit (from the past 72 hour(s)). No results found.    Assessment and Plan: 51 y.o. male with coccyx pain. Unclear etiology. There may be a groin infection. Plan for trial of oral doxycycline along with some offloading. Recheck if not better.   No orders of the defined types were placed in this encounter.  Meds ordered this encounter  Medications  . doxycycline (VIBRA-TABS) 100 MG tablet    Sig: Take 1 tablet (100 mg total) by mouth 2 (two) times daily.    Dispense:  14 tablet    Refill:  0     Discussed warning signs or symptoms. Please see discharge instructions. Patient expresses understanding.

## 2017-03-15 ENCOUNTER — Telehealth: Payer: Self-pay | Admitting: Family Medicine

## 2017-03-15 ENCOUNTER — Ambulatory Visit (INDEPENDENT_AMBULATORY_CARE_PROVIDER_SITE_OTHER): Payer: PRIVATE HEALTH INSURANCE | Admitting: Family Medicine

## 2017-03-15 ENCOUNTER — Encounter: Payer: Self-pay | Admitting: Family Medicine

## 2017-03-15 VITALS — BP 147/83 | HR 68 | Wt 265.0 lb

## 2017-03-15 DIAGNOSIS — M172 Bilateral post-traumatic osteoarthritis of knee: Secondary | ICD-10-CM | POA: Diagnosis not present

## 2017-03-15 NOTE — Progress Notes (Signed)
Marc Horton is a 51 y.o. male who [facility] today for follow up knee pain BL.  Patient has chronic knee pain bilateral left worse than right present for years. The pain has recently been treated in February with a bilateral steroid injection. The injection provide a good lasting pain relief until recently. He's had worsening return of pain and swelling. He denies any locking or catching fevers or chills.   Past Medical History:  Diagnosis Date  . Hypercholesteremia   . Pneumonia    Past Surgical History:  Procedure Laterality Date  . APPENDECTOMY    . KNEE ARTHROSCOPY Right    Social History  Substance Use Topics  . Smoking status: Never Smoker  . Smokeless tobacco: Not on file  . Alcohol use No     ROS:  As above   Medications: Current Outpatient Prescriptions  Medication Sig Dispense Refill  . diclofenac sodium (VOLTAREN) 1 % GEL Apply 4 g topically 4 (four) times daily. To affected joint. 100 g 11   No current facility-administered medications for this visit.    No Known Allergies   Exam:  BP (!) 147/83   Pulse 68   Wt 265 lb (120.2 kg)   BMI 35.94 kg/m  General: Well Developed, well nourished, and in no acute distress.  Neuro/Psych: Alert and oriented x3, extra-ocular muscles intact, able to move all 4 extremities, sensation grossly intact. Skin: Warm and dry, no rashes noted.  Respiratory: Not using accessory muscles, speaking in full sentences, trachea midline.  Cardiovascular: Pulses palpable, no extremity edema. Abdomen: Does not appear distended. MSK:  Right knee mild effusion no erythema Range of motion 0-120 with retropatellar crepitations present. Nontender. Stable ligamentous exam. Intact flexion and extension strength.  Left knee moderate effusion no erythema Range of motion 0-120 with retropatellar crepitations present. Nontender. Stable ligamentous exam. Intact flexion and  extension strength.  Procedure: Real-time Ultrasound Guided Injection of right knee  Device: GE Logiq E  Images permanently stored and available for review in the ultrasound unit. Verbal informed consent obtained. Discussed risks and benefits of procedure. Warned about infection bleeding damage to structures skin hypopigmentation and fat atrophy among others. Patient expresses understanding and agreement Time-out conducted.  Noted no overlying erythema, induration, or other signs of local infection.  Skin prepped in a sterile fashion.  Local anesthesia: Topical Ethyl chloride.  With sterile technique and under real time ultrasound guidance: 80 mg of Kenalog and 4 mL of Marcaine injected easily.  Completed without difficulty  Pain immediately resolved suggesting accurate placement of the medication.  Advised to call if fevers/chills, erythema, induration, drainage, or persistent bleeding.  Images permanently stored and available for review in the ultrasound unit.  Impression: Technically successful ultrasound guided injection.  Procedure: Real-time Ultrasound Guided Injection of left knee  Device: GE Logiq E  Images permanently stored and available for review in the ultrasound unit. Verbal informed consent obtained. Discussed risks and benefits of procedure. Warned about infection bleeding damage to structures skin hypopigmentation and fat atrophy among others. Patient expresses understanding and agreement Time-out conducted.  Noted no overlying erythema, induration, or other signs of local infection.  Skin prepped in a sterile fashion.  Local anesthesia: Topical Ethyl chloride.  With sterile technique and under real time ultrasound guidance: 80 mg of Kenalog and 4 mL of Marcaine injected easily.  Completed without difficulty  Pain immediately resolved suggesting accurate placement of the medication.  Advised to call if  fevers/chills, erythema, induration, drainage,  or persistent bleeding.  Images permanently stored and available for review in the ultrasound unit.  Impression: Technically successful ultrasound guided injection.   X-ray right knee 11-22-16: IMPRESSION: Severe tricompartment degenerative change. Loose bodies may be present. Small knee joint effusion cannot be excluded. No acute bony abnormality identified.  X-ray left knee 11-22-16: IMPRESSION: Severe tricompartment degenerative change with loose bodies. Small knee joint effusion. No acute bony abnormality.   Assessment and Plan: 51 y.o. male with bilateral knee pain due to DJD. Status post injection today. We'll also authorize Orthovisc.  Additionally we'll discuss other options including weight loss and quadriceps strengthening.  Recheck as needed    No orders of the defined types were placed in this encounter.  No orders of the defined types were placed in this encounter.   Discussed warning signs or symptoms. Please see discharge instructions. Patient expresses understanding.

## 2017-03-15 NOTE — Patient Instructions (Signed)
Thank you for coming in today. Recheck as needed.  Work on Systems developerquad strength and weight loss.  We will prior auth the gel shots.  Return sooner if needed.   Call or go to the ER if you develop a large red swollen joint with extreme pain or oozing puss.

## 2017-03-15 NOTE — Telephone Encounter (Signed)
Submitted for approval on Orthovisc. Awaiting confirmation.  

## 2017-03-15 NOTE — Telephone Encounter (Signed)
-----   Message from Rodolph BongEvan S Corey, MD sent at 03/15/2017 10:12 AM EDT ----- Please prior Lissa Moralesauth orthovisc

## 2017-03-28 NOTE — Telephone Encounter (Signed)
Received the following information from OV benefits investigation:   Orthovisc is not covered. Buy and [employer] [place] is not allowed. The deductible does not apply. Once the out of pocket is met, insurance will cover 100% of the allowable amount. A copay applies if an office visit is billed in the amount of $40.00. Prior Authorization is not required. The product can only be obtained via the pharmacy option under the medical benefit through [facility] [phone]. Call reference number is [id number].

## 2017-03-29 NOTE — Telephone Encounter (Signed)
Do I send a prescription for Orthovisc or some other product to the specialty pharmacy?

## 2017-03-29 NOTE — Telephone Encounter (Signed)
Orthovisc is not covered. Please sent different Rx. Unsure what is preferred, that information was not determined.

## 2017-03-30 MED ORDER — SODIUM HYALURONATE (VISCOSUP) 25 MG/2.5ML IX SOSY: PREFILLED_SYRINGE | INTRA_ARTICULAR | 0 refills | Status: DC

## 2017-03-30 NOTE — Telephone Encounter (Signed)
Prescription for Supartz into pharmacy

## 2017-03-30 NOTE — Addendum Note (Signed)
Addended by: Rodolph BongOREY, Lori Liew S on: 03/30/2017 01:41 PM   Modules accepted: Orders

## 2017-04-02 NOTE — Telephone Encounter (Signed)
orthovisc calling for clarification of which knee is this medication going to be injected into.    spoke w/darren pharmacist gave Dr. Zollie Peeorey's NPI # [id number] Sig, Dx code,office phone # and fax # address.[place], [address] [place]

## 2017-04-04 NOTE — Telephone Encounter (Signed)
Received fax for PA. Form completed and faxed back.

## 2017-04-09 NOTE — Telephone Encounter (Signed)
PA for Supartz denied. -EH/RMA

## 2017-04-10 NOTE — Telephone Encounter (Signed)
Can we investigate which medicine will be approved?

## 2017-04-16 ENCOUNTER — Telehealth: Payer: Self-pay

## 2017-04-16 NOTE — Telephone Encounter (Signed)
Do I need to do Rx for Euflexxa or will we order it.

## 2017-04-16 NOTE — Telephone Encounter (Signed)
Spartz denied.  Pt must have a trial and failure or intolerance to an hyaluronic acid derivatives: Euflexxa, Synvisc or Synvisc-One.

## 2017-04-17 MED ORDER — SODIUM HYALURONATE (VISCOSUP) 20 MG/2ML IX SOSY: 2.0000 mL | PREFILLED_SYRINGE | INTRA_ARTICULAR | 0 refills | Status: DC

## 2017-04-17 NOTE — Telephone Encounter (Signed)
I did not submit the request but based on note, I would suggest sending Euflexxa (please include diagnosis code in Rx instructions). Will route back to see what speciality pharmacy this should be sent to. This information should be listed on the OV benefits investigation.

## 2017-04-17 NOTE — Addendum Note (Signed)
Addended by: Rodolph BongOREY, Manroop Jakubowicz S on: 04/17/2017 04:42 PM   Modules accepted: Orders

## 2017-04-17 NOTE — Telephone Encounter (Signed)
Euflexxa sent. 

## 2017-04-19 ENCOUNTER — Telehealth: Payer: Self-pay

## 2017-04-19 NOTE — Telephone Encounter (Signed)
Pt insurance has approved Euflexxa ([id number]). Left VM informing pt medication has been approved and to [facility].

## 2017-04-20 NOTE — Telephone Encounter (Signed)
Error

## 2017-10-10 ENCOUNTER — Telehealth: Payer: Self-pay

## 2017-10-10 ENCOUNTER — Telehealth: Payer: Self-pay | Admitting: Family Medicine

## 2017-10-10 MED ORDER — OSELTAMIVIR PHOSPHATE 75 MG PO CAPS: 75.0000 mg | ORAL_CAPSULE | Freq: Every day | ORAL | 0 refills | Status: DC

## 2017-10-10 NOTE — Telephone Encounter (Signed)
Agree Tamiflu

## 2017-10-10 NOTE — Telephone Encounter (Signed)
No labs, pending PCP approval.  

## 2017-10-10 NOTE — Telephone Encounter (Signed)
Opened in error

## 2017-10-21 ENCOUNTER — Encounter: Payer: Self-pay | Admitting: Emergency Medicine

## 2017-10-21 ENCOUNTER — Emergency Department (INDEPENDENT_AMBULATORY_CARE_PROVIDER_SITE_OTHER)
Admission: EM | Admit: 2017-10-21 | Discharge: 2017-10-21 | Disposition: A | Discharge status: Home / Self Care | Payer: PRIVATE HEALTH INSURANCE | Source: Home / Self Care | Attending: Family Medicine | Admitting: Family Medicine

## 2017-10-21 DIAGNOSIS — J069 Acute upper respiratory infection, unspecified: Secondary | ICD-10-CM

## 2017-10-21 MED ORDER — AZITHROMYCIN 250 MG PO TABS: 250.0000 mg | ORAL_TABLET | Freq: Every day | ORAL | 0 refills | Status: DC

## 2017-10-21 MED ORDER — BENZONATATE 100 MG PO CAPS: 100.0000 mg | ORAL_CAPSULE | Freq: Three times a day (TID) | ORAL | 0 refills | Status: DC

## 2017-10-21 MED ORDER — ACETAMINOPHEN 325 MG PO TABS
975.0000 mg | ORAL_TABLET | Freq: Once | ORAL | Status: AC
  Administered 2017-10-21: 975 mg via ORAL

## 2017-10-21 NOTE — Discharge Instructions (Signed)
°  You may take 500mg acetaminophen every 4-6 hours or in combination with ibuprofen 400-600mg every 6-8 hours as needed for pain, inflammation, and fever. ° °Be sure to drink at least eight 8oz glasses of water to stay well hydrated and get at least 8 hours of sleep at night, preferably more while sick.  ° °Please take antibiotics as prescribed and be sure to complete entire course even if you start to feel better to ensure infection does not come back. ° °

## 2017-10-21 NOTE — ED Provider Notes (Addendum)
[facility]    CSN: [id number] Arrival date & time: 10/21/17  1412     History   Chief Complaint Chief Complaint  Patient presents with  . Cough    HPI Marc Horton is a 52 y.o. male.   HPI  Marc Horton is a 52 y.o. male presenting to UC with c/o worsening productive cough, Right ear pain, HA, fever, and decreased sleep from the cough for the last 3-4 days.  He reports being exposed to the flu by his son a couple weeks ago.  He took Tamiflu and was feeling better until a few days ago when he became sick again.  No hx of asthma. He has had pneumonia before.  He has been taking mucinex and tylenol, last dose around 8AM.   Past Medical History:  Diagnosis Date  . Hypercholesteremia   . Pneumonia     Patient Active Problem List   Diagnosis Date Noted  . Arthritis of knee, degenerative 11/22/2016  . Cough 11/11/2015  . Elevated blood pressure 11/11/2015  . Obese 11/11/2015  . Tachycardia 11/11/2015  . Low libido 11/11/2015    Past Surgical History:  Procedure Laterality Date  . APPENDECTOMY    . KNEE ARTHROSCOPY Right        Home Medications    Prior to Admission medications   Medication Sig Start Date End Date Taking? Authorizing Provider  Acetaminophen (TYLENOL PO) Take by mouth.   Yes [provider]  Dextromethorphan-Guaifenesin (MUCINEX DM PO) Take by mouth.   Yes [provider]  azithromycin (ZITHROMAX) 250 MG tablet Take 1 tablet (250 mg total) by mouth daily. Take first 2 tablets together, then 1 every day until finished. 10/21/17   Lurene ShadowPhelps, [name] Swider O, PA-C  benzonatate (TESSALON) 100 MG capsule Take 1-2 capsules (100-200 mg total) by mouth every 8 (eight) hours. 10/21/17   Lurene ShadowPhelps, Loyde Orth O, PA-C  diclofenac sodium (VOLTAREN) 1 % GEL Apply 4 g topically 4 (four) times daily. To affected joint. 11/22/16   Rodolph Bongorey, Evan S, MD    Family History Family History  Problem Relation Age of Onset  . Diabetes Mother   . Heart disease  Mother   . Heart failure Mother   . Heart disease Father   . Heart failure Father     Social History Social History   Tobacco Use  . Smoking status: Never Smoker  . Smokeless tobacco: Never Used  Substance Use Topics  . Alcohol use: No  . Drug use: No     Allergies   Patient has no known allergies.   Review of Systems Review of Systems  Constitutional: Positive for chills, fatigue and fever.  HENT: Positive for congestion, ear pain (Right) and sore throat. Negative for trouble swallowing and voice change.   Respiratory: Positive for cough. Negative for shortness of breath.   Cardiovascular: Negative for chest pain and palpitations.  Gastrointestinal: Negative for abdominal pain, diarrhea, nausea and vomiting.  Musculoskeletal: Negative for arthralgias, back pain and myalgias.  Skin: Negative for rash.  Neurological: Positive for headaches. Negative for dizziness and light-headedness.     Physical Exam Triage Vital Signs ED Triage Vitals [10/21/17 1430]  Enc Vitals Group     BP (!) 151/90     Pulse Rate (!) 118     Resp      Temp 100.1 F (37.8 C)     Temp Source Oral     SpO2 96 %     Weight 256 lb  4 oz (116.2 kg)     Height 6' (1.829 m)     Head Circumference      Peak Flow      Pain Score 8     Pain Loc      Pain Edu?      Excl. in GC?    No data found.  Updated Vital Signs BP (!) 151/90 (BP Location: Right Arm)   Pulse (!) 118   Temp 100.1 F (37.8 C) (Oral)   Ht 6' (1.829 m)   Wt 256 lb 4 oz (116.2 kg)   SpO2 96%   BMI 34.75 kg/m   Visual Acuity Right Eye Distance:   Left Eye Distance:   Bilateral Distance:    Right Eye Near:   Left Eye Near:    Bilateral Near:     Physical Exam  Constitutional: He is oriented to person, place, and time. He appears well-developed and well-nourished. No distress.  HENT:  Head: Normocephalic and atraumatic.  Right Ear: Tympanic membrane normal.  Left Ear: Tympanic membrane normal.  Nose: Nose  normal. Right sinus exhibits no maxillary sinus tenderness and no frontal sinus tenderness. Left sinus exhibits no maxillary sinus tenderness and no frontal sinus tenderness.  Mouth/Throat: Uvula is midline, oropharynx is clear and moist and mucous membranes are normal.  Eyes: EOM are normal.  Neck: Normal range of motion. Neck supple.  Cardiovascular: Regular rhythm. Tachycardia present.  Pulmonary/Chest: Effort normal and breath sounds normal. No stridor. No respiratory distress. He has no wheezes. He has no rales.  Productive hacking cough during exam but able to speak in full sentences between coughs.  Musculoskeletal: Normal range of motion.  Lymphadenopathy:    He has no cervical adenopathy.  Neurological: He is alert and oriented to person, place, and time.  Skin: Skin is warm and dry. He is not diaphoretic.  Psychiatric: He has a normal mood and affect. His behavior is normal.  Nursing note and vitals reviewed.    UC Treatments / Results  Labs (all labs ordered are listed, but only abnormal results are displayed) Labs Reviewed - No data to display  EKG  EKG Interpretation None       Radiology No results found.  Procedures Procedures (including critical care time)  Medications Ordered in UC Medications  acetaminophen (TYLENOL) tablet 975 mg (975 mg Oral Given 10/21/17 1434)     Initial Impression / Assessment and Plan / UC Course  I have reviewed the triage vital signs and the nursing notes.  Pertinent labs & imaging results that were available during my care of the patient were reviewed by me and considered in my medical decision making (see chart for details).     Tachycardia likely from fever and/or coughing throughout UC visit Tylenol given in triage for fever Will cover for secondary bacterial infection Home care instructions provided Work note provided for next 2 days off F/u with PCP in 3-4 days if not improving    Final Clinical Impressions(s) /  UC Diagnoses   Final diagnoses:  Upper respiratory tract infection, unspecified type    ED Discharge Orders        Ordered    benzonatate (TESSALON) 100 MG capsule  Every 8 hours     10/21/17 1445    azithromycin (ZITHROMAX) 250 MG tablet  Daily     10/21/17 1445       Controlled Substance Prescriptions [place] Controlled Substance Registry consulted? Not Applicable   Rolla Plate 10/21/17  [address]    Lurene Shadow, [place] 10/21/17 [phone]

## 2017-10-21 NOTE — ED Triage Notes (Signed)
Patient complaining of being exposed to the flu from his son, son's doctor called in Tamiflu, took Tamiflu felt better then a couple of days later, the productive cough started, right ear ache, headache, fever, decrease in sleep.

## 2017-10-22 ENCOUNTER — Telehealth: Payer: Self-pay

## 2017-10-22 NOTE — Telephone Encounter (Signed)
Called patient and left message to call UC for instructions.  Hours of operation left on answering machine as well as contact information.

## 2017-10-22 NOTE — Telephone Encounter (Signed)
Pt called back, information from Dr Cathren HarshBeese given.  Pt will try and see if he can sleep.

## 2017-10-22 NOTE — Telephone Encounter (Signed)
Persistent cough at night. Stop Tessalon daytime, and begin plain Mucinex 1200mg  twice daily with lots of water. At bedtime take Tessalon 100mg , two caps.  May add Delsym 10mL at bedtime also.

## 2017-10-23 ENCOUNTER — Encounter: Payer: Self-pay | Admitting: Family Medicine

## 2017-10-23 ENCOUNTER — Ambulatory Visit (INDEPENDENT_AMBULATORY_CARE_PROVIDER_SITE_OTHER): Payer: PRIVATE HEALTH INSURANCE | Admitting: Family Medicine

## 2017-10-23 ENCOUNTER — Ambulatory Visit (INDEPENDENT_AMBULATORY_CARE_PROVIDER_SITE_OTHER): Payer: PRIVATE HEALTH INSURANCE

## 2017-10-23 VITALS — BP 162/92 | HR 98 | Temp 98.5°F | Wt 253.0 lb

## 2017-10-23 DIAGNOSIS — R05 Cough: Secondary | ICD-10-CM | POA: Diagnosis not present

## 2017-10-23 DIAGNOSIS — R059 Cough, unspecified: Secondary | ICD-10-CM

## 2017-10-23 MED ORDER — HYDROCODONE-HOMATROPINE 5-1.5 MG/5ML PO SYRP: 5.0000 mL | ORAL_SOLUTION | Freq: Four times a day (QID) | ORAL | 0 refills | Status: DC | PRN

## 2017-10-23 MED ORDER — CEFDINIR 300 MG PO CAPS: 300.0000 mg | ORAL_CAPSULE | Freq: Two times a day (BID) | ORAL | 0 refills | Status: DC

## 2017-10-23 NOTE — Patient Instructions (Signed)
Thank you for coming in today. Start Omnicef. Continue Azithromycin.  Use hycodan syrup for cough as needed.   Recheck if not better.   Call or go to the emergency room if you get worse, have trouble breathing, have chest pains, or palpitations.    Cough, Adult A cough helps to clear your throat and lungs. A cough may last only 2-3 weeks (acute), or it may last longer than 8 weeks (chronic). Many different things can cause a cough. A cough may be a sign of an illness or another medical condition. Follow these instructions at home:  Pay attention to any changes in your cough.  Take medicines only as told by your doctor. ? If you were prescribed an antibiotic medicine, take it as told by your doctor. Do not stop taking it even if you start to feel better. ? Talk with your doctor before you try using a cough medicine.  Drink enough fluid to keep your pee (urine) clear or pale yellow.  If the air is dry, use a cold steam vaporizer or humidifier in your home.  Stay away from things that make you cough at work or at home.  If your cough is worse at night, try using extra pillows to raise your head up higher while you sleep.  Do not smoke, and try not to be around smoke. If you need help quitting, ask your doctor.  Do not have caffeine.  Do not drink alcohol.  Rest as needed. Contact a doctor if:  You have new problems (symptoms).  You cough up yellow fluid (pus).  Your cough does not get better after 2-3 weeks, or your cough gets worse.  Medicine does not help your cough and you are not sleeping well.  You have pain that gets worse or pain that is not helped with medicine.  You have a fever.  You are losing weight and you do not know why.  You have night sweats. Get help right away if:  You cough up blood.  You have trouble breathing.  Your heartbeat is very fast. This information is not intended to replace advice given to you by your health care provider. Make  sure you discuss any questions you have with your health care provider. Document Released: 06/08/2011 Document Revised: 03/02/2016 Document Reviewed: 12/02/2014 Elsevier Interactive Patient Education  [employer]2018 [employer]

## 2017-10-23 NOTE — Progress Notes (Signed)
Marc Horton is a 52 y.o. male who presents to [name]: Primary Care Sports Medicine today for cough and shortness of breath.  Patient with 4 day history of productive cough, shortness of breath, and low-grade fevers. Patient was ill 2 weeks ago with presumed influenza (both son and wife had flu), which he contracted. Patient was ill with soreness, gi distress, and fever for 2-3 days. Symptoms resolved mostly and patient returned to work. Patient started to feel ill 6 days ago, but become significantly SOB this past Friday. Now with uncontrolled cough and green mucous production. Tmax was on Friday to 101 and patient has had subjective fever/chills the last several days. Patient seen at urgent care over weekend. They prescribed azithromycin, which the patient is on Day3 of today. Urgent care did not order CXR.    Past Medical History:  Diagnosis Date  . Hypercholesteremia   . Pneumonia    Past Surgical History:  Procedure Laterality Date  . APPENDECTOMY    . KNEE ARTHROSCOPY Right    Social History   Tobacco Use  . Smoking status: Never Smoker  . Smokeless tobacco: Never Used  Substance Use Topics  . Alcohol use: No   family history includes Diabetes in his mother; Heart disease in his father and mother; Heart failure in his father and mother.  ROS as above:  Medications: Current Outpatient Medications  Medication Sig Dispense Refill  . Acetaminophen (TYLENOL PO) Take by mouth.    [name] Kitchen azithromycin (ZITHROMAX) 250 MG tablet Take 1 tablet (250 mg total) by mouth daily. Take first 2 tablets together, then 1 every day until finished. 6 tablet 0  . benzonatate (TESSALON) 100 MG capsule Take 1-2 capsules (100-200 mg total) by mouth every 8 (eight) hours. 21 capsule 0  . Dextromethorphan-Guaifenesin (MUCINEX DM PO) Take by mouth.    . diclofenac sodium (VOLTAREN) 1 % GEL Apply 4 g topically  4 (four) times daily. To affected joint. 100 g 11  . cefdinir (OMNICEF) 300 MG capsule Take 1 capsule (300 mg total) by mouth 2 (two) times daily. 14 capsule 0  . HYDROcodone-homatropine (HYCODAN) 5-1.5 MG/5ML syrup Take 5 mLs by mouth every 6 (six) hours as needed for cough. 180 mL 0   No current facility-administered medications for this visit.    No Known Allergies  Health Maintenance Health Maintenance  Topic Date Due  . Hepatitis C Screening  07[birth year]/1964  . HIV Screening  10/24/1978  . COLONOSCOPY  10/24/2013  . INFLUENZA VACCINE  05/09/2017  . TETANUS/TDAP  08/09/2020   Exam:  BP (!) 162/92   Pulse 98   Temp 98.5 F (36.9 C) (Oral)   Wt 253 lb (114.8 kg)   BMI 34.31 kg/m  Gen: Ill appearing, but nontoxic. HEENT: EOMI,  MMM. No facial pressure or tenderness. R eye with conjunctivitis and minimal lower lid swelling. No palpable lymph nodes.  Lungs: Normal breathing rate. Breath sounds diminished at bases, but difficult to assess due to profuse cough. No crackles at bases.  Heart: RRR no MRG Abd: NABS, Soft. Nondistended, Nontender Exts: Brisk capillary refill, warm and well perfused.   CXR:  Bronchitis changes. No infiltrate of other acute findings.  Awaiting formal radiology review.   No results found for this or any previous visit (from the past 72 hour(s)). No results found.    Assessment and Plan: 52 y.o. male with 4 days low grade fever, shortness of breath, and severe cough.  Patient currently being treated for presumed CAP with azithromycin. We will order CXR today to ensure possible pneumonia has not progressed.   Due to severity of symptoms, it is reasonable to broaden coverage with cephalosporin.   We will control cough with hycodan cough syrup.   Patient should return to care if not improving, or if worsening over the next several days.    Orders Placed This Encounter  Procedures  . DG Chest 2 View    Order Specific Question:   Reason for exam:     Answer:   Cough, assess intra-thoracic pathology    Order Specific Question:   Preferred imaging [facility]?    [facility]   Meds ordered this encounter  Medications  . cefdinir (OMNICEF) 300 MG capsule    Sig: Take 1 capsule (300 mg total) by mouth 2 (two) times daily.    Dispense:  14 capsule    Refill:  0  . HYDROcodone-homatropine (HYCODAN) 5-1.5 MG/5ML syrup    Sig: Take 5 mLs by mouth every 6 (six) hours as needed for cough.    Dispense:  180 mL    Refill:  0     Discussed warning signs or symptoms. Please see discharge instructions. Patient expresses understanding.

## 2017-10-24 MED ORDER — ALBUTEROL SULFATE HFA 108 (90 BASE) MCG/ACT IN AERS: 2.0000 | INHALATION_SPRAY | Freq: Four times a day (QID) | RESPIRATORY_TRACT | 0 refills | Status: DC | PRN

## 2017-11-01 ENCOUNTER — Ambulatory Visit (INDEPENDENT_AMBULATORY_CARE_PROVIDER_SITE_OTHER): Payer: PRIVATE HEALTH INSURANCE | Admitting: Sports Medicine

## 2017-11-01 ENCOUNTER — Encounter: Payer: Self-pay | Admitting: Sports Medicine

## 2017-11-01 DIAGNOSIS — R05 Cough: Secondary | ICD-10-CM

## 2017-11-01 DIAGNOSIS — R058 Other specified cough: Secondary | ICD-10-CM

## 2017-11-01 MED ORDER — HYDROCOD POLST-CPM POLST ER 10-8 MG/5ML PO SUER: 5.0000 mL | Freq: Two times a day (BID) | ORAL | 0 refills | Status: DC | PRN

## 2017-11-01 NOTE — Progress Notes (Signed)
  Subjective:    CC: Still coughing  HPI: Marc Horton is a pleasant, previously healthy 52 year old male, went for 3 weeks now he has had a cough, he was treated with Tamiflu, azithromycin, Hycodan, Tessalon Perles.  His cough is now just a dry intermittent and annoying cough, it does keep him up at night.  Nonproductive.  No constitutional symptoms, overall feels well.  No shortness of breath, no chest pain, no GI symptoms, no rash.  I reviewed the past medical history, family history, social history, surgical history, and allergies today and no changes were needed.  Please see the problem list section below in epic for further details.  Past Medical History: Past Medical History:  Diagnosis Date  . Hypercholesteremia   . Pneumonia    Past Surgical History: Past Surgical History:  Procedure Laterality Date  . APPENDECTOMY    . KNEE ARTHROSCOPY Right    Social History: Social History   Socioeconomic History  . Marital status: Single    Spouse name: None  . Number of children: None  . Years of education: None  . Highest education level: None  Social Needs  . Financial resource strain: None  . Food insecurity - worry: None  . Food insecurity - inability: None  . Transportation needs - medical: None  . Transportation needs - non-medical: None  Occupational History  . None  Tobacco Use  . Smoking status: Never Smoker  . Smokeless tobacco: Never Used  Substance and Sexual Activity  . Alcohol use: No  . Drug use: No  . Sexual activity: None  Other Topics Concern  . None  Social History Narrative   ** Merged History Encounter **       Family History: Family History  Problem Relation Age of Onset  . Diabetes Mother   . Heart disease Mother   . Heart failure Mother   . Heart disease Father   . Heart failure Father    Allergies: No Known Allergies Medications: See med rec.  Review of Systems: No fevers, chills, night sweats, weight loss, chest pain, or shortness of  breath.   Objective:    General: Well Developed, well nourished, and in no acute distress.  Neuro: Alert and oriented x3, extra-ocular muscles intact, sensation grossly intact.  HEENT: Normocephalic, atraumatic, pupils equal round reactive to light, neck supple, no masses, no lymphadenopathy, thyroid nonpalpable.  Oropharynx, nasopharynx, ear canals unremarkable Skin: Warm and dry, no rashes. Cardiac: Regular rate and rhythm, no murmurs rubs or gallops, no lower extremity edema.  Respiratory: Clear to auscultation bilaterally. Not using accessory muscles, speaking in full sentences.  Impression and Recommendations:    Post-viral cough syndrome Postinfectious cough syndrome. Switching to Tussionex at bedtime, he will do 200 mg of benzonatate during the day. Ultimately we need the tincture of time.  I spent 25 minutes with this patient, greater than 50% was face-to-face time counseling regarding the above diagnoses.  ___________________________________________ Ihor Austinhomas J. Benjamin Stainhekkekandam, M.D., ABFM., CAQSM. Primary Care and Sports Medicine [facility]  Adjunct Instructor of Family Medicine  [employer]

## 2017-11-01 NOTE — Assessment & Plan Note (Signed)
Postinfectious cough syndrome. Switching to Tussionex at bedtime, he will do 200 mg of benzonatate during the day. Ultimately we need the tincture of time.

## 2018-01-01 ENCOUNTER — Ambulatory Visit: Payer: Self-pay | Admitting: Family Medicine

## 2018-01-24 ENCOUNTER — Ambulatory Visit (INDEPENDENT_AMBULATORY_CARE_PROVIDER_SITE_OTHER): Payer: PRIVATE HEALTH INSURANCE | Admitting: Family Medicine

## 2018-01-24 VITALS — BP 156/90 | HR 80 | Temp 97.9°F | Ht 72.0 in | Wt 265.0 lb

## 2018-01-24 DIAGNOSIS — M172 Bilateral post-traumatic osteoarthritis of knee: Secondary | ICD-10-CM

## 2018-01-24 DIAGNOSIS — I10 Essential (primary) hypertension: Secondary | ICD-10-CM | POA: Diagnosis not present

## 2018-01-24 DIAGNOSIS — L259 Unspecified contact dermatitis, unspecified cause: Secondary | ICD-10-CM | POA: Diagnosis not present

## 2018-01-24 MED ORDER — LISINOPRIL 10 MG PO TABS
10.0000 mg | ORAL_TABLET | Freq: Every day | ORAL | 1 refills | Status: DC
Start: 2018-01-24 — End: 2024-08-31

## 2018-01-24 MED ORDER — TRIAMCINOLONE ACETONIDE 0.5 % EX CREA: 1.0000 "application " | TOPICAL_CREAM | Freq: Two times a day (BID) | CUTANEOUS | 3 refills | Status: DC

## 2018-01-24 NOTE — Patient Instructions (Signed)
Thank you for coming in today. Apply the cream 2x daily for 1 month.  Take lisinopril for blood pressure.  Return in 1 month.  On recheck please come fasting eating only your medicine and water that morning.    2000 calories per day will result in 1 pound of weight loss per week.  Lisinopril tablets What is this medicine? LISINOPRIL (lyse IN oh pril) is an ACE inhibitor. This medicine is used to treat high blood pressure and heart failure. It is also used to protect the heart immediately after a heart attack. This medicine may be used for other purposes; ask your health care provider or pharmacist if you have questions. COMMON BRAND NAME(S): Prinivil, Zestril What should I tell my health care provider before I take this medicine? They need to know if you have any of these conditions: -diabetes -heart or blood vessel disease -kidney disease -low blood pressure -previous swelling of the tongue, face, or lips with difficulty breathing, difficulty swallowing, hoarseness, or tightening of the throat -an unusual or allergic reaction to lisinopril, other ACE inhibitors, insect venom, foods, dyes, or preservatives -pregnant or trying to get pregnant -breast-feeding How should I use this medicine? Take this medicine by mouth with a glass of water. Follow the directions on your prescription label. You may take this medicine with or without food. If it upsets your stomach, take it with food. Take your medicine at regular intervals. Do not take it more often than directed. Do not stop taking except on your doctor's advice. Talk to your pediatrician regarding the use of this medicine in children. Special care may be needed. While this drug may be prescribed for children as young as 26 years of age for selected conditions, precautions do apply. Overdosage: If you think you have taken too much of this medicine contact a poison control center or emergency room at once. NOTE: This medicine is only for you.  Do not share this medicine with others. What if I miss a dose? If you miss a dose, take it as soon as you can. If it is almost time for your next dose, take only that dose. Do not take double or extra doses. What may interact with this medicine? Do not take this medicine with any of the following medications: -hymenoptera venom -sacubitril; valsartan This medicines may also interact with the following medications: -aliskiren -angiotensin receptor blockers, like losartan or valsartan -certain medicines for diabetes -diuretics -everolimus -gold compounds -lithium -NSAIDs, medicines for pain and inflammation, like ibuprofen or naproxen -potassium salts or supplements -salt substitutes -sirolimus -temsirolimus This list may not describe all possible interactions. Give your health care provider a list of all the medicines, herbs, non-prescription drugs, or dietary supplements you use. Also tell them if you smoke, drink alcohol, or use illegal drugs. Some items may interact with your medicine. What should I watch for while using this medicine? Visit your doctor or health care professional for regular check ups. Check your blood pressure as directed. Ask your doctor what your blood pressure should be, and when you should contact him or her. Do not treat yourself for coughs, colds, or pain while you are using this medicine without asking your doctor or health care professional for advice. Some ingredients may increase your blood pressure. Women should inform their doctor if they wish to become pregnant or think they might be pregnant. There is a potential for serious side effects to an unborn child. Talk to your health care professional or pharmacist for more  information. Check with your doctor or health care professional if you get an attack of severe diarrhea, nausea and vomiting, or if you sweat a lot. The loss of too much body fluid can make it dangerous for you to take this medicine. You may  get drowsy or dizzy. Do not drive, use machinery, or do anything that needs mental alertness until you know how this drug affects you. Do not stand or sit up quickly, especially if you are an older patient. This reduces the risk of dizzy or fainting spells. Alcohol can make you more drowsy and dizzy. Avoid alcoholic drinks. Avoid salt substitutes unless you are told otherwise by your doctor or health care professional. What side effects may I notice from receiving this medicine? Side effects that you should report to your doctor or health care professional as soon as possible: -allergic reactions like skin rash, itching or hives, swelling of the hands, feet, face, lips, throat, or tongue -breathing problems -signs and symptoms of kidney injury like trouble passing urine or change in the amount of urine -signs and symptoms of increased potassium like muscle weakness; chest pain; or fast, irregular heartbeat -signs and symptoms of liver injury like dark yellow or brown urine; general ill feeling or flu-like symptoms; light-colored stools; loss of appetite; nausea; right upper belly pain; unusually weak or tired; yellowing of the eyes or skin -signs and symptoms of low blood pressure like dizziness; feeling faint or lightheaded, falls; unusually weak or tired -stomach pain with or without nausea and vomiting Side effects that usually do not require medical attention (report to your doctor or health care professional if they continue or are bothersome): -changes in taste -cough -dizziness -fever -headache -sensitivity to light This list may not describe all possible side effects. Call your doctor for medical advice about side effects. You may report side effects to FDA at 1-800-FDA-1088. Where should I keep my medicine? Keep out of the reach of children. Store at room temperature between 15 and 30 degrees C (59 and 86 degrees F). Protect from moisture. Keep container tightly closed. Throw away any  unused medicine after the expiration date. NOTE: This sheet is a summary. It may not cover all possible information. If you have questions about this medicine, talk to your doctor, pharmacist, or health care provider.  2018 Elsevier/Gold Standard (2015-11-15 12:52:35)

## 2018-01-24 NOTE — Progress Notes (Signed)
Marc Horton is a 52 y.o. male who presents to [facility]: Primary Care Sports Medicine today for left knee rash, hypertension, knee arthritis and pain.  Marc Horton has left knee pain and arthritis.  He has been receiving stem cell and platelet rich plasma injections via a local chiropractor over the last several months.  He notes this is helping his knee pain a bit.  He notes he still has pain that limits activities but he notes the pain is now not waking him from sleep which he is delighted with.  Notes however he developed a rash on the anterior aspect of the knee over the last few months.  He does not think the rash is related to the injections as the site is over the anterior tibia and he typically gets the injections in the medial joint space next to the patellar tendon.  He denies any significant new knee pain or swelling.  He has not tried any treatment for the rash.  He notes the rash is mildly itchy but not particularly bothersome.  He denies any new medications.  Hypertension: Marc Horton has a history of elevated blood pressure but is not currently taking any medications.  He is been resistant to taking medications in the past.  He denies chest pain palpitations or shortness of breath.  Morbid obesity: Marc Horton notes that he is gained weight recently.  He is trying to lose weight but does not really have any structured plantar exercise program.  He notes his knee pain has limited his exercise recently.  He does not eat a careful diet.   Past Medical History:  Diagnosis Date  . Hypercholesteremia   . Pneumonia    Past Surgical History:  Procedure Laterality Date  . APPENDECTOMY    . KNEE ARTHROSCOPY Right    Social History   Tobacco Use  . Smoking status: Never Smoker  . Smokeless tobacco: Never Used  Substance Use Topics  . Alcohol use: No   family history includes Diabetes in his  mother; Heart disease in his father and mother; Heart failure in his father and mother.  ROS as above:  Medications: Current Outpatient Medications  Medication Sig Dispense Refill  . Acetaminophen (TYLENOL PO) Take by mouth.    Marc Horton Kitchen albuterol (PROVENTIL HFA;VENTOLIN HFA) 108 (90 Base) MCG/ACT inhaler Inhale 2 puffs into the lungs every 6 (six) hours as needed for wheezing or shortness of breath. 1 Inhaler 0  . lisinopril (PRINIVIL,ZESTRIL) 10 MG tablet Take 1 tablet (10 mg total) by mouth daily. 30 tablet 1  . triamcinolone cream (KENALOG) 0.5 % Apply 1 application topically 2 (two) times daily. To affected areas. 30 g 3   No current facility-administered medications for this visit.    No Known Allergies  Health Maintenance Health Maintenance  Topic Date Due  . Hepatitis C Screening  May 01, 1966  . HIV Screening  10/24/1978  . COLONOSCOPY  10/24/2013  . INFLUENZA VACCINE  05/09/2018  . TETANUS/TDAP  08/09/2020     Exam:  BP (!) 156/90   Pulse 80   Temp 97.9 F (36.6 C) (Oral)   Ht 6' (1.829 m)   Wt 265 lb (120.2 kg)   BMI 35.94 kg/m   Wt Readings from Last 5 Encounters:  01/24/18 265 lb (120.2 kg)  11/01/17 252 lb (114.3 kg)  10/23/17 253 lb (114.8 kg)  10/21/17 256 lb 4 oz (116.2 kg)  03/15/17 265 lb (120.2 kg)    Gen:  Well NAD HEENT: EOMI,  MMM Lungs: Normal work of breathing. CTABL Heart: RRR no MRG Abd: NABS, Soft. Nondistended, Nontender Exts: Brisk capillary refill, warm and well perfused.  Left knee no effusion or erythema. 4 cm circular patch of mild salmon-colored macular rash with no significant scale at the skin overlying the anterior tibia several centimeters away from the actual knee joint.  Nontender blanchable.      No results found for this or any previous visit (from the past 72 hour(s)). No results found.    Assessment and Plan: 52 y.o. male with  Rash: Unclear etiology likely contact dermatitis.  Trial of triamcinolone cream.  Recheck in  1 month.  Knee Pain: DJD improving with atypical treatment including PRP and stem cell therapy.  Hypertension: Ongoing not treated yet.  Start lisinopril and check labs next visit.  Morbid obesity: Discussed weight loss strategies and exercise strategies.  This will also help hypertension and knee pain due to DJD.  No orders of the defined types were placed in this encounter.  Meds ordered this encounter  Medications  . lisinopril (PRINIVIL,ZESTRIL) 10 MG tablet    Sig: Take 1 tablet (10 mg total) by mouth daily.    Dispense:  30 tablet    Refill:  1  . triamcinolone cream (KENALOG) 0.5 %    Sig: Apply 1 application topically 2 (two) times daily. To affected areas.    Dispense:  30 g    Refill:  3     Discussed warning signs or symptoms. Please see discharge instructions. Patient expresses understanding.

## 2019-01-07 ENCOUNTER — Ambulatory Visit (INDEPENDENT_AMBULATORY_CARE_PROVIDER_SITE_OTHER): Payer: PRIVATE HEALTH INSURANCE | Admitting: Family Medicine

## 2019-01-07 ENCOUNTER — Telehealth: Payer: Self-pay | Admitting: Family Medicine

## 2019-01-07 ENCOUNTER — Encounter: Payer: Self-pay | Admitting: Family Medicine

## 2019-01-07 ENCOUNTER — Other Ambulatory Visit: Payer: Self-pay

## 2019-01-07 VITALS — Temp 98.6°F | Ht 72.0 in | Wt 260.0 lb

## 2019-01-07 DIAGNOSIS — L237 Allergic contact dermatitis due to plants, except food: Secondary | ICD-10-CM

## 2019-01-07 MED ORDER — TRIAMCINOLONE ACETONIDE 0.1 % EX CREA: 1.0000 "application " | TOPICAL_CREAM | Freq: Two times a day (BID) | CUTANEOUS | 12 refills | Status: DC

## 2019-01-07 MED ORDER — PREDNISONE 5 MG (48) PO TBPK
ORAL_TABLET | ORAL | 0 refills | Status: DC
Start: 2019-01-07 — End: 2020-09-06

## 2019-01-07 MED ORDER — TRIAMCINOLONE ACETONIDE 0.5 % EX CREA: 1.0000 "application " | TOPICAL_CREAM | Freq: Two times a day (BID) | CUTANEOUS | 3 refills | Status: DC

## 2019-01-07 NOTE — Patient Instructions (Signed)
Thank you for coming in today. Apply the tube of 0.5% triamcinolone cream to the hotspots Apply the large tub of 0.1% triamcinolone cream to the larger areas Use Goldbond itch as needed for temporary control of itching Apply triamcinolone 2-3 times a day  If worsening or not improving start taking the oral prednisone Dosepak  Okay to also use Benadryl at bedtime for itching  If you think you have been exposed to poison ivy in the future make sure to quickly wash off the oil with warm soapy water.  Dish soap works pretty well for this.   Poison Ivy Dermatitis  Poison ivy dermatitis is inflammation of the skin that is caused by the allergens on the leaves of the poison ivy plant. The skin reaction often involves redness, swelling, blisters, and extreme itching. What are the causes? This condition is caused by a specific chemical (urushiol) found in the sap of the poison ivy plant. This chemical is sticky and can be easily spread to people, animals, and objects. You can get poison ivy dermatitis by:  Having direct contact with a poison ivy plant.  Touching animals, other people, or objects that have come in contact with poison ivy and have the chemical on them. What increases the risk? This condition is more likely to develop in:  People who are outdoors often.  People who go outdoors without wearing protective clothing, such as closed shoes, long pants, and a long-sleeved shirt. What are the signs or symptoms? Symptoms of this condition include:  Redness and itching.  A rash that often includes bumps and blisters. The rash usually appears 48 hours after exposure.  Swelling. This may occur if the reaction is more severe. Symptoms usually last for 1-2 weeks. However, the first time you develop this condition, symptoms may last 3-4 weeks. How is this diagnosed? This condition may be diagnosed based on your symptoms and a physical exam. Your health care provider may also ask you about  any recent outdoor activity. How is this treated? Treatment for this condition will vary depending on how severe it is. Treatment may include:  Hydrocortisone creams or calamine lotions to relieve itching.  Oatmeal baths to soothe the skin.  Over-the-counter antihistamine tablets.  Oral steroid medicine for more severe outbreaks. Follow these instructions at home:  Take or apply over-the-counter and prescription medicines only as told by your health care provider.  Wash exposed skin as soon as possible with soap and cold water.  Use hydrocortisone creams or calamine lotion as needed to soothe the skin and relieve itching.  Take oatmeal baths as needed. Use colloidal oatmeal. You can get this at your local pharmacy or grocery store. Follow the instructions on the packaging.  Do not scratch or rub your skin.  While you have the rash, wash clothes right after you wear them. How is this prevented?   Learn to identify the poison ivy plant and avoid contact with the plant. This plant can be recognized by the number of leaves. Generally, poison ivy has three leaves with flowering branches on a single stem. The leaves are typically glossy, and they have jagged edges that come to a point at the front.  If you have been exposed to poison ivy, thoroughly wash with soap and water right away. You have about 30 minutes to remove the plant resin before it will cause the rash. Be sure to wash under your fingernails because any plant resin there will continue to spread the rash.  When hiking  or camping, wear clothes that will help you to avoid exposure on the skin. This includes long pants, a long-sleeved shirt, tall socks, and hiking boots. You can also apply preventive lotion to your skin to help limit exposure.  If you suspect that your clothes or outdoor gear came in contact with poison ivy, rinse them off outside with a garden hose before you bring them inside your house. Contact a health care  provider if:  You have open sores in the rash area.  You have more redness, swelling, or pain in the affected area.  You have redness that spreads beyond the rash area.  You have fluid, blood, or pus coming from the affected area.  You have a fever.  You have a rash over a large area of your body.  You have a rash on your eyes, mouth, or genitals.  Your rash does not improve after a few days. Get help right away if:  Your face swells or your eyes swell shut.  You have trouble breathing.  You have trouble swallowing. This information is not intended to replace advice given to you by your health care provider. Make sure you discuss any questions you have with your health care provider. Document Released: 09/22/2000 Document Revised: 03/08/2017 Document Reviewed: 03/03/2015 Elsevier Interactive Patient Education  2019 [employer].

## 2019-01-07 NOTE — Telephone Encounter (Signed)
Error

## 2019-01-07 NOTE — Telephone Encounter (Signed)
I went ahead and sent in some steroid cream however this does require a WebEx office visit.  Please asked patient to schedule WebEx office visit today this afternoon.  Clayburn Pert

## 2019-01-07 NOTE — Telephone Encounter (Signed)
Left a message stating his appointment was changed to 1:20.

## 2019-01-07 NOTE — Telephone Encounter (Signed)
Pt left a message stating he had gotten into poison oak on Saturday, states he has it on his face and arms pretty bad. Pt asked if we could call something into pharmacy for him.  [facility].. Will forward to provider for review.

## 2019-01-07 NOTE — Telephone Encounter (Signed)
Patient scheduled.

## 2019-01-07 NOTE — Progress Notes (Addendum)
Virtual Visit  via Video Note  I connected with      Marc Horton  by a video enabled telemedicine application and verified that I am speaking with the correct person using two identifiers.   I discussed the limitations of evaluation and management by telemedicine and the availability of in person appointments. The patient expressed understanding and agreed to proceed.  History of Present Illness: Marc Horton is a 53 y.o. male who would like to discuss poison ivy dermatitis.  Dam helped a friend chop down some trees on Saturday,  March 28.  He developed a rash today.  He thinks he was exposed to poison ivy.  He is not had much treatment yet.  He tried removing the oils with soap.  He notes itchy rash on his left arm face and trunk.  He feels well with no fevers chills nausea vomiting or diarrhea.  No new medications soaps detergents or shampoos.  He is happy with how things are going.    Rash left arm and face left chest wall.  Saturday. Helping cut down trees   Observations/Objective: Temp 98.6 F (37 C) (Oral)   Ht 6' (1.829 m)   Wt 260 lb (117.9 kg)   BMI 35.26 kg/m  Wt Readings from Last 5 Encounters:  01/07/19 260 lb (117.9 kg)  01/24/18 265 lb (120.2 kg)  11/01/17 252 lb (114.3 kg)  10/23/17 253 lb (114.8 kg)  10/21/17 256 lb 4 oz (116.2 kg)   Exam: Appearance nontoxic.  Well-appearing no acute distress. Normal Speech.  Shortness of breath hoarse voice quality or wheezing.  No cough. Skin: Maculopapular rash with some vesicles on left forearm.  Maculopapular rash without vesicles on the left lateral face and on trunk   Lab and Radiology Results No results found for this or any previous visit (from the past 72 hour(s)). No results found.   Assessment and Plan: 53 y.o. male with poison ivy dermatitis.  Treatment with 0.5% triamcinolone cream on hotspots and 0.1% triamcinolone cream on larger areas.  Additionally use Benadryl at bedtime and [place] itch  for temporary control of itching.  Backup prednisone Dosepak sent to pharmacy for use if worsening or not improving.  Cautioned against taking oral steroids now as I think it is not necessary and may increase his risk for COVID-19 severity.  Recheck as needed.  Virtual visit done due to COVID-19 pandemic  PDMP not reviewed this encounter. No orders of the defined types were placed in this encounter.  No orders of the defined types were placed in this encounter.   Follow Up Instructions:    I discussed the assessment and treatment plan with the patient. The patient was provided an opportunity to ask questions and all were answered. The patient agreed with the plan and demonstrated an understanding of the instructions.   The patient was advised to call back or seek an in-person evaluation if the symptoms worsen or if the condition fails to improve as anticipated.  I provided 21 minutes of non-face-to-face time during this encounter.    Historical information moved to improve visibility of documentation.  Past Medical History:  Diagnosis Date  . Hypercholesteremia   . Pneumonia    Past Surgical History:  Procedure Laterality Date  . APPENDECTOMY    . KNEE ARTHROSCOPY Right    Social History   Tobacco Use  . Smoking status: Never Smoker  . Smokeless tobacco: Never Used  Substance Use Topics  . Alcohol  use: No   family history includes Diabetes in his mother; Heart disease in his father and mother; Heart failure in his father and mother.  Medications: Current Outpatient Medications  Medication Sig Dispense Refill  . Acetaminophen (TYLENOL PO) Take by mouth.    [name] Kitchen albuterol (PROVENTIL HFA;VENTOLIN HFA) 108 (90 Base) MCG/ACT inhaler Inhale 2 puffs into the lungs every 6 (six) hours as needed for wheezing or shortness of breath. 1 Inhaler 0  . lisinopril (PRINIVIL,ZESTRIL) 10 MG tablet Take 1 tablet (10 mg total) by mouth daily. 30 tablet 1  . triamcinolone cream (KENALOG)  0.5 % Apply 1 application topically 2 (two) times daily. To affected areas. 30 g 3   No current facility-administered medications for this visit.    No Known Allergies Addendum to correct incorrect date due to templating error

## 2019-01-13 ENCOUNTER — Ambulatory Visit: Payer: Self-pay | Admitting: Family Medicine

## 2019-08-16 IMAGING — DX DG CHEST 2V
2 series · 2 of 2 positions shown · non-contrast
Comparison: 11/11/2015

CLINICAL DATA: Cough and congestion for a few weeks, son had
influenza a 3 weeks ago, persistent cough symptoms

EXAM:
CHEST  2 VIEW

[chest pa]
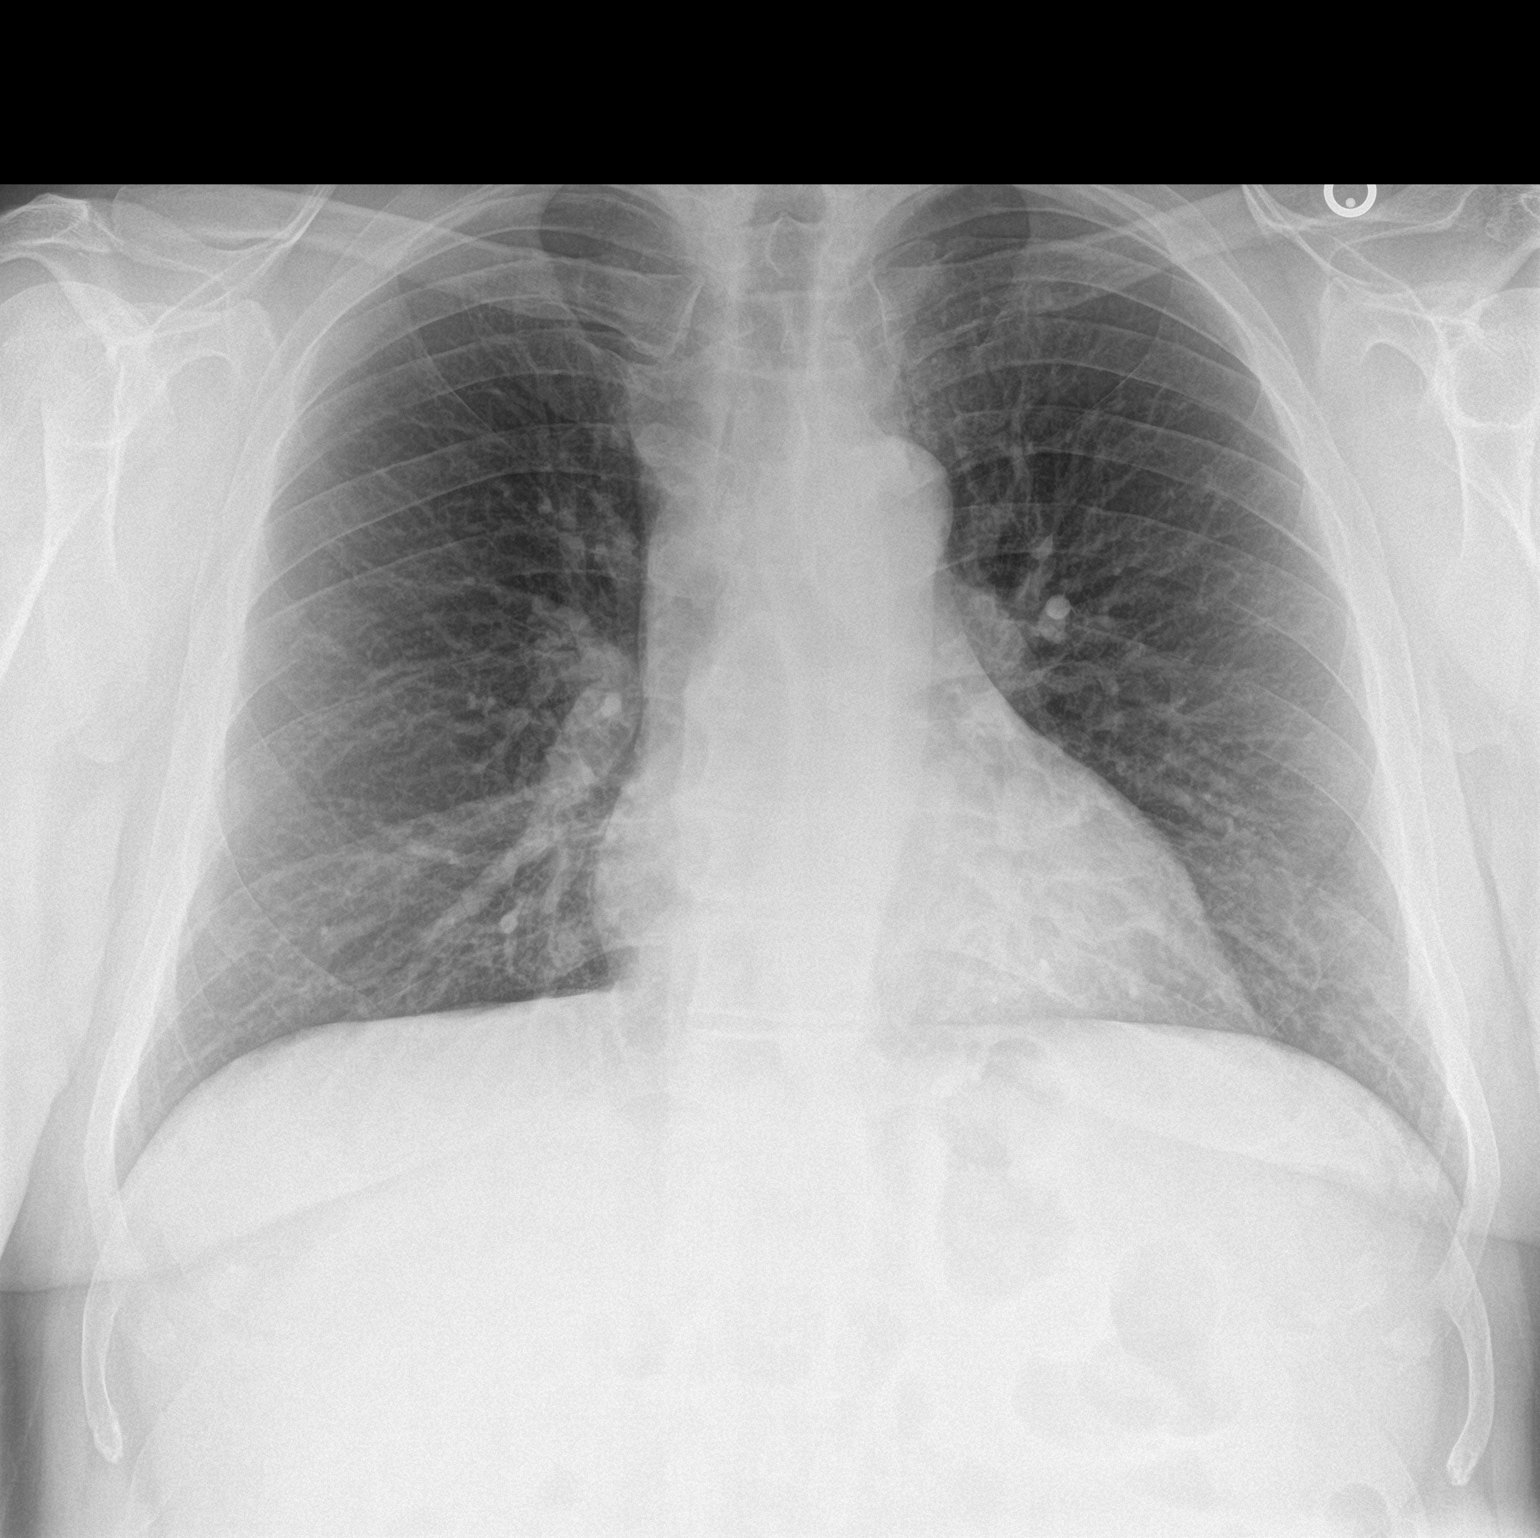

[chest lat]
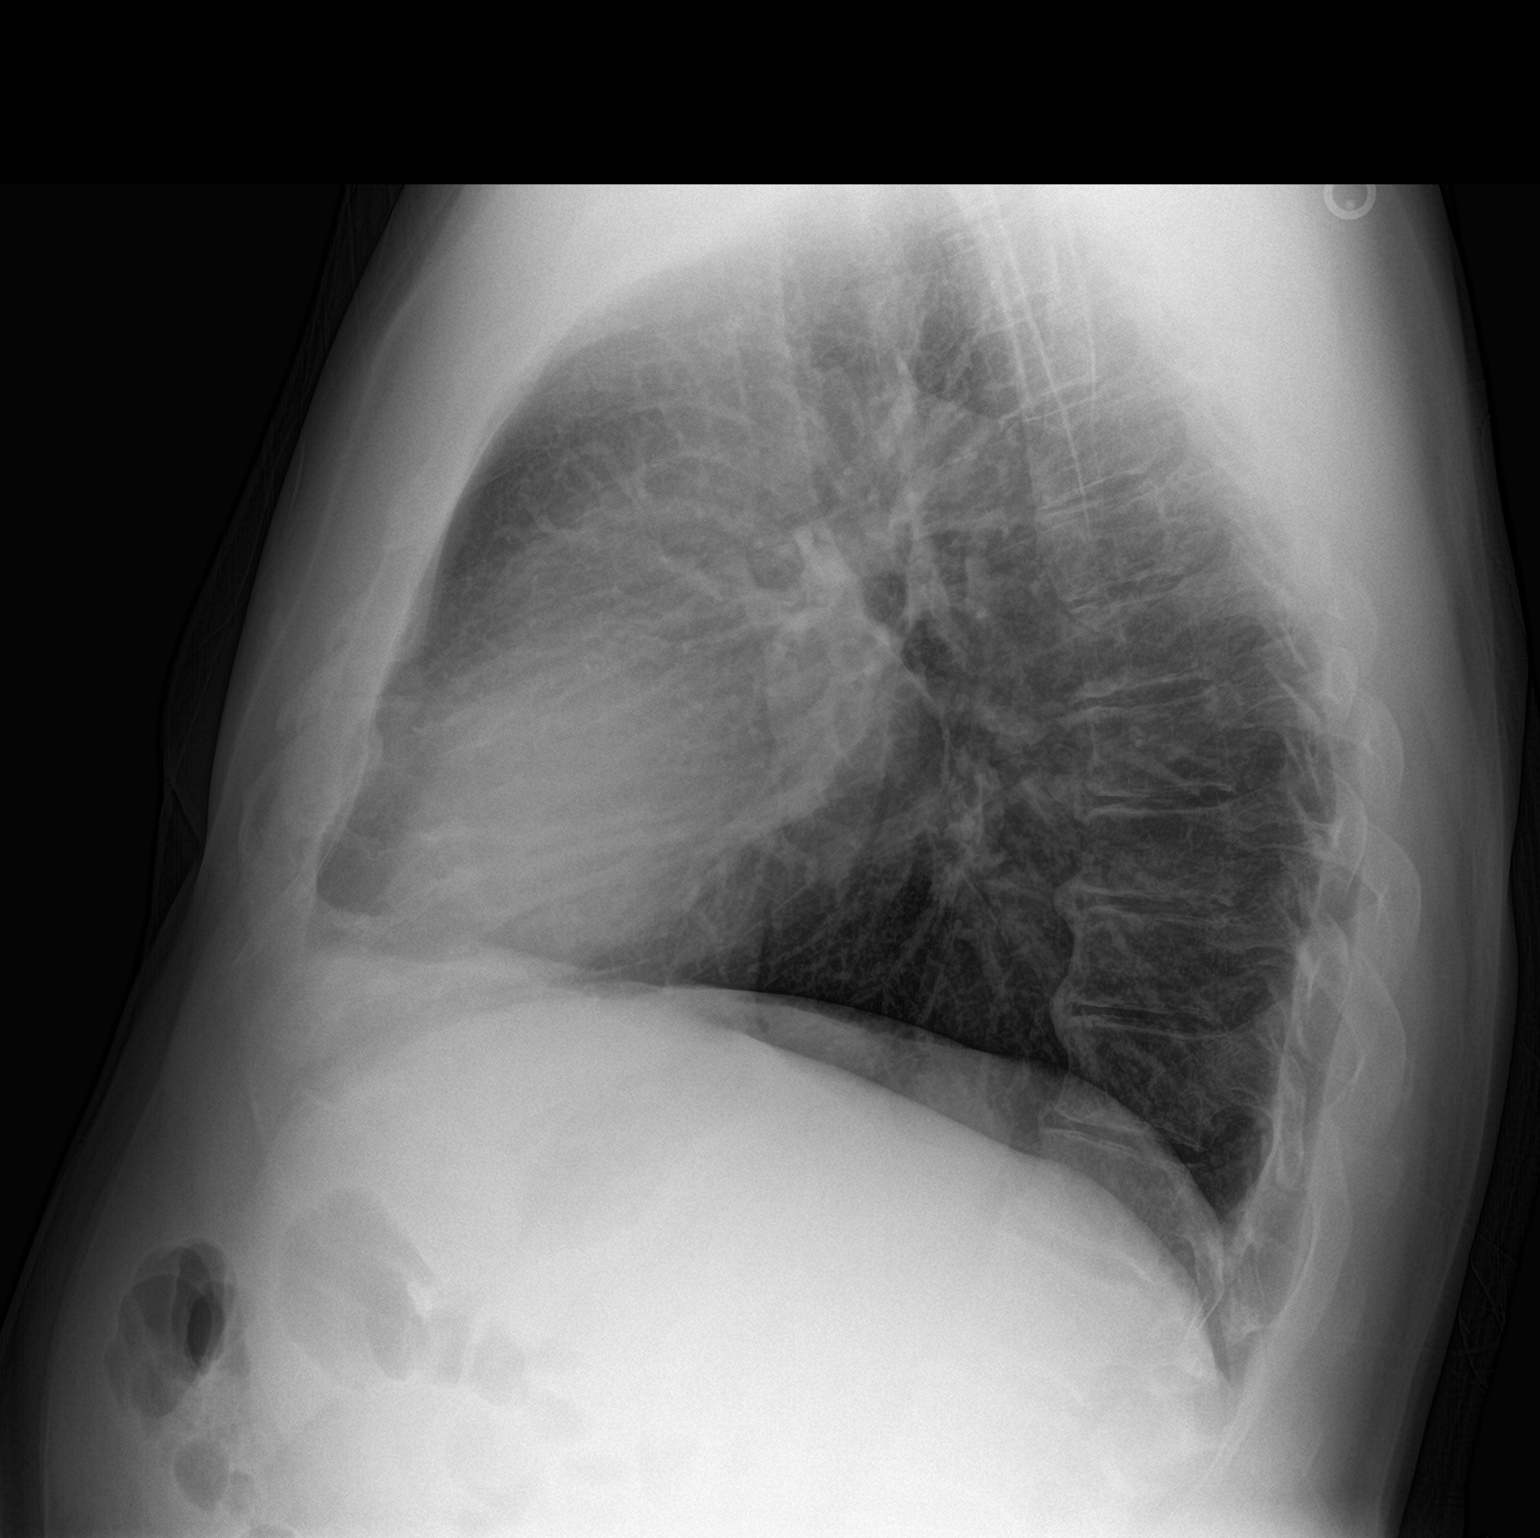

[2 of 2 positions shown; findings below may reference images not displayed]

FINDINGS: Upper normal heart size.

Mediastinal contours and pulmonary vascularity normal.

Lungs clear.

No pulmonary infiltrate, pleural effusion or pneumothorax.

Bones unremarkable.
IMPRESSION: No acute abnormalities.

## 2020-08-03 ENCOUNTER — Other Ambulatory Visit: Payer: Self-pay

## 2020-08-03 ENCOUNTER — Emergency Department (INDEPENDENT_AMBULATORY_CARE_PROVIDER_SITE_OTHER)
Admission: EM | Admit: 2020-08-03 | Discharge: 2020-08-03 | Disposition: A | Discharge status: Home / Self Care | Payer: Managed Care, Other (non HMO) | Source: Home / Self Care | Attending: Family Medicine | Admitting: Family Medicine

## 2020-08-03 DIAGNOSIS — L03012 Cellulitis of left finger: Secondary | ICD-10-CM

## 2020-08-03 MED ORDER — DOXYCYCLINE HYCLATE 100 MG PO CAPS: ORAL_CAPSULE | ORAL | 0 refills | Status: DC

## 2020-08-03 NOTE — Discharge Instructions (Addendum)
Begin warm soaks 2 to 3 times daily.  Wear splint for protection.

## 2020-08-03 NOTE — ED Provider Notes (Signed)
[facility]    CSN: [id number] Arrival date & time: 08/03/20  1842      History   Chief Complaint Chief Complaint  Patient presents with   Finger infection    LT middle    HPI Marc Horton is a 54 y.o. male.   Patient bumped his left middle fingertip while working on his car one week ago.  He has had progressive pain, redness, and swelling.  He has had no improvement while applying Neosporin.  The history is provided by the patient.  Hand Pain This is a new problem. Episode onset: one week ago. The problem occurs constantly. The problem has been gradually worsening. Exacerbated by: contact. Nothing relieves the symptoms. Treatments tried: Neosporin ointment. The treatment provided no relief.    Past Medical History:  Diagnosis Date   Hypercholesteremia    Pneumonia     Patient Active Problem List   Diagnosis Date Noted   Arthritis of knee, degenerative 11/22/2016   HTN (hypertension) 11/11/2015   Morbid obesity (HCC) 11/11/2015   Tachycardia 11/11/2015   Low libido 11/11/2015    Past Surgical History:  Procedure Laterality Date   APPENDECTOMY     KNEE ARTHROSCOPY Right        Home Medications    Prior to Admission medications   Medication Sig Start Date End Date Taking? Authorizing Provider  Acetaminophen (TYLENOL PO) Take by mouth.    [provider]  albuterol (PROVENTIL HFA;VENTOLIN HFA) 108 (90 Base) MCG/ACT inhaler Inhale 2 puffs into the lungs every 6 (six) hours as needed for wheezing or shortness of breath. 10/24/17   Rodolph Bong, MD  doxycycline (VIBRAMYCIN) 100 MG capsule Take one cap PO Q12hr with food. 08/03/20   Lattie Haw, MD  lisinopril (PRINIVIL,ZESTRIL) 10 MG tablet Take 1 tablet (10 mg total) by mouth daily. 01/24/18   Rodolph Bong, MD  predniSONE (STERAPRED UNI-PAK 48 TAB) 5 MG (48) TBPK tablet 12 day dosepack po. Take if worsening for poison ivy 01/07/19   Rodolph Bong, MD  triamcinolone cream  (KENALOG) 0.1 % Apply 1 application topically 2 (two) times daily. 01/07/19   Rodolph Bong, MD  triamcinolone cream (KENALOG) 0.5 % Apply 1 application topically 2 (two) times daily. To affected areas. 01/07/19   Rodolph Bong, MD    Family History Family History  Problem Relation Age of Onset   Diabetes Mother    Heart disease Mother    Heart failure Mother    Heart disease Father    Heart failure Father     Social History Social History   Tobacco Use   Smoking status: Never Smoker   Smokeless tobacco: Never Used  Vaping Use   Vaping Use: Never used  Substance Use Topics   Alcohol use: No   Drug use: No     Allergies   Patient has no known allergies.   Review of Systems Review of Systems  Constitutional: Negative for chills, diaphoresis, fatigue and fever.  Skin: Positive for color change. Negative for rash.  All other systems reviewed and are negative.    Physical Exam Triage Vital Signs ED Triage Vitals  Enc Vitals Group     BP 08/03/20 1901 (!) 181/91     Pulse Rate 08/03/20 1901 70     Resp 08/03/20 1901 18     Temp 08/03/20 1901 97.8 F (36.6 C)     Temp Source 08/03/20 1901 Oral     SpO2  08/03/20 1901 96 %     Weight --      Height --      Head Circumference --      Peak Flow --      Pain Score 08/03/20 1902 7     Pain Loc --      Pain Edu? --      Excl. in GC? --    No data found.  Updated Vital Signs BP (!) 181/91 (BP Location: Right Arm)    Pulse 70    Temp 97.8 F (36.6 C) (Oral)    Resp 18    SpO2 96%   Visual Acuity Right Eye Distance:   Left Eye Distance:   Bilateral Distance:    Right Eye Near:   Left Eye Near:    Bilateral Near:     Physical Exam Vitals and nursing note reviewed.  Constitutional:      General: He is not in acute distress. HENT:     Head: Normocephalic.  Cardiovascular:     Rate and Rhythm: Normal rate.  Pulmonary:     Effort: Pulmonary effort is normal.  Musculoskeletal:       Hands:      Comments: Radial edge of left third fingertip is erythematous, swollen, and tender to palpation.  No fluctuance.  Skin:    General: Skin is warm and dry.     Capillary Refill: Capillary refill takes less than 2 seconds.  Neurological:     Mental Status: He is alert.      UC Treatments / Results  Labs (all labs ordered are listed, but only abnormal results are displayed) Labs Reviewed - No data to display  EKG   Radiology No results found.  Procedures Procedures (including critical care time)  Medications Ordered in UC Medications - No data to display  Initial Impression / Assessment and Plan / UC Course  I have reviewed the triage vital signs and the nursing notes.  Pertinent labs & imaging results that were available during my care of the patient were reviewed by me and considered in my medical decision making (see chart for details).    Begin doxycycline for staph coverage.  Return for worsening pain/swelling.  Dispensed protective splint.   Final Clinical Impressions(s) / UC Diagnoses   Final diagnoses:  Paronychia of left middle finger     Discharge Instructions     Begin warm soaks 2 to 3 times daily.  Wear splint for protection.    ED Prescriptions    Medication Sig Dispense Auth. Provider   doxycycline (VIBRAMYCIN) 100 MG capsule Take one cap PO Q12hr with food. 20 capsule Lattie Haw, MD        Lattie Haw, MD 08/05/20 [phone]

## 2020-08-03 NOTE — ED Triage Notes (Signed)
Pt c/o LT middle finger infection. Says he was working on his car and bumped it and thinks it got infected. Red and swollen, as well as painful. Neosporin and bandage changing daily. Ibuprofen prn.

## 2020-08-21 ENCOUNTER — Other Ambulatory Visit: Payer: Self-pay

## 2020-08-21 ENCOUNTER — Emergency Department (INDEPENDENT_AMBULATORY_CARE_PROVIDER_SITE_OTHER)
Admission: EM | Admit: 2020-08-21 | Discharge: 2020-08-21 | Disposition: A | Discharge status: Home / Self Care | Payer: Managed Care, Other (non HMO) | Source: Home / Self Care

## 2020-08-21 DIAGNOSIS — L02413 Cutaneous abscess of right upper limb: Secondary | ICD-10-CM | POA: Diagnosis not present

## 2020-08-21 MED ORDER — CLINDAMYCIN HCL 300 MG PO CAPS: 300.0000 mg | ORAL_CAPSULE | Freq: Three times a day (TID) | ORAL | 0 refills | Status: AC

## 2020-08-21 NOTE — ED Provider Notes (Signed)
[facility]    CSN: [id number] Arrival date & time: 08/21/20  1603      History   Chief Complaint Chief Complaint  Patient presents with  . Abscess    HPI Marc Horton is a 54 y.o. male.   HPI  Marc Horton is a 54 y.o. male presenting to UC with c/o 1 week gradually worsening redness, pain and swelling of Right upper arm. Pt believes he was bit by a spider but is not certain.  He has applied neosporin without relief. Denies fever, chills, n/v/d.    Past Medical History:  Diagnosis Date  . Hypercholesteremia   . Pneumonia     Patient Active Problem List   Diagnosis Date Noted  . Arthritis of knee, degenerative 11/22/2016  . HTN (hypertension) 11/11/2015  . Morbid obesity (HCC) 11/11/2015  . Tachycardia 11/11/2015  . Low libido 11/11/2015    Past Surgical History:  Procedure Laterality Date  . APPENDECTOMY    . KNEE ARTHROSCOPY Right        Home Medications    Prior to Admission medications   Medication Sig Start Date End Date Taking? Authorizing Provider  Acetaminophen (TYLENOL PO) Take by mouth.    [provider]  albuterol (PROVENTIL HFA;VENTOLIN HFA) 108 (90 Base) MCG/ACT inhaler Inhale 2 puffs into the lungs every 6 (six) hours as needed for wheezing or shortness of breath. 10/24/17   Rodolph Bong, MD  clindamycin (CLEOCIN) 300 MG capsule Take 1 capsule (300 mg total) by mouth 3 (three) times daily for 7 days. 08/21/20 08/28/20  Lurene Shadow, PA-C  doxycycline (VIBRAMYCIN) 100 MG capsule Take one cap PO Q12hr with food. 08/03/20   Lattie Haw, MD  lisinopril (PRINIVIL,ZESTRIL) 10 MG tablet Take 1 tablet (10 mg total) by mouth daily. 01/24/18   Rodolph Bong, MD  predniSONE (STERAPRED UNI-PAK 48 TAB) 5 MG (48) TBPK tablet 12 day dosepack po. Take if worsening for poison ivy 01/07/19   Rodolph Bong, MD  triamcinolone cream (KENALOG) 0.1 % Apply 1 application topically 2 (two) times daily. 01/07/19   Rodolph Bong, MD    triamcinolone cream (KENALOG) 0.5 % Apply 1 application topically 2 (two) times daily. To affected areas. 01/07/19   Rodolph Bong, MD    Family History Family History  Problem Relation Age of Onset  . Diabetes Mother   . Heart disease Mother   . Heart failure Mother   . Heart disease Father   . Heart failure Father     Social History Social History   Tobacco Use  . Smoking status: Never Smoker  . Smokeless tobacco: Never Used  Vaping Use  . Vaping Use: Never used  Substance Use Topics  . Alcohol use: No  . Drug use: No     Allergies   Patient has no known allergies.   Review of Systems Review of Systems  Constitutional: Negative for chills and fever.  Musculoskeletal: Negative for arthralgias, joint swelling and myalgias.  Skin: Positive for color change and wound.     Physical Exam Triage Vital Signs ED Triage Vitals  Enc Vitals Group     BP 08/21/20 1621 (!) 149/88     Pulse Rate 08/21/20 1621 65     Resp 08/21/20 1621 15     Temp 08/21/20 1621 97.9 F (36.6 C)     Temp Source 08/21/20 1621 Oral     SpO2 08/21/20 1621 99 %  Weight --      Height --      Head Circumference --      Peak Flow --      Pain Score 08/21/20 1620 5     Pain Loc --      Pain Edu? --      Excl. in GC? --    No data found.  Updated Vital Signs BP (!) 149/88 (BP Location: Left Arm)   Pulse 65   Temp 97.9 F (36.6 C) (Oral)   Resp 15   SpO2 99%   Visual Acuity Right Eye Distance:   Left Eye Distance:   Bilateral Distance:    Right Eye Near:   Left Eye Near:    Bilateral Near:     Physical Exam Vitals and nursing note reviewed.  Constitutional:      Appearance: Normal appearance. He is well-developed.  HENT:     Head: Normocephalic and atraumatic.  Cardiovascular:     Rate and Rhythm: Normal rate.  Pulmonary:     Effort: Pulmonary effort is normal.  Musculoskeletal:        General: Normal range of motion.     Cervical back: Normal range of motion.   Skin:    General: Skin is warm and dry.     Findings: Erythema present.       Neurological:     Mental Status: He is alert and oriented to person, place, and time.  Psychiatric:        Behavior: Behavior normal.      UC Treatments / Results  Labs (all labs ordered are listed, but only abnormal results are displayed) Labs Reviewed  WOUND CULTURE    EKG   Radiology No results found.  Procedures Incision and Drainage  Date/Time: 08/21/2020 5:03 PM Performed by: Lurene Shadow, PA-C Authorized by: Lurene Shadow, PA-C   Consent:    Consent obtained:  Verbal   Consent given by:  Patient   Risks discussed:  Bleeding, incomplete drainage, infection, pain and damage to other organs   Alternatives discussed:  No treatment and delayed treatment Location:    Type:  Abscess   Size:  2cm   Location:  Upper extremity   Upper extremity location:  Arm   Arm location:  R upper arm Pre-procedure details:    Skin preparation:  Chloraprep Anesthesia (see MAR for exact dosages):    Anesthesia method:  Local infiltration   Local anesthetic:  Lidocaine 1% WITH epi Procedure type:    Complexity:  Simple Procedure details:    Incision types:  Single straight   Incision depth:  Dermal   Scalpel blade:  11   Wound management:  Probed and deloculated   Drainage amount:  Scant   Wound treatment:  Wound left open   Packing materials:  None Post-procedure details:    Patient tolerance of procedure:  Tolerated well, no immediate complications   (including critical care time)  Medications Ordered in UC Medications - No data to display  Initial Impression / Assessment and Plan / UC Course  I have reviewed the triage vital signs and the nursing notes.  Pertinent labs & imaging results that were available during my care of the patient were reviewed by me and considered in my medical decision making (see chart for details).    Hx and exam c/w skin abscess Wound culture  sent Will start on clindamycin  F/u in 2-3 days for recheck if not improving, sooner if worsening  AVS given  Final Clinical Impressions(s) / UC Diagnoses   Final diagnoses:  Abscess of arm, right     Discharge Instructions      Keep wound clean with warm water and mild soap. Change bandage 2-3 times daily while it is still bleeding and draining.   Please take antibiotics as prescribed and be sure to complete entire course even if you start to feel better to ensure infection does not come back.  Follow up in 2-3 days if not improving, sooner if significantly worsening.     ED Prescriptions    Medication Sig Dispense Auth. Provider   clindamycin (CLEOCIN) 300 MG capsule Take 1 capsule (300 mg total) by mouth 3 (three) times daily for 7 days. 21 capsule Lurene Shadow, [place]     PDMP not reviewed this encounter.   Lurene Shadow, [place] 08/21/20 1707

## 2020-08-21 NOTE — ED Triage Notes (Signed)
Patient presents to Urgent Care with complaints of abscess on upper inner right arm since about a week ago. Patient reports he thinks it started out as a spider bite, denies drainage. Area is swollen and dark in center of wound.

## 2020-08-21 NOTE — Discharge Instructions (Signed)
  Keep wound clean with warm water and mild soap. Change bandage 2-3 times daily while it is still bleeding and draining.   Please take antibiotics as prescribed and be sure to complete entire course even if you start to feel better to ensure infection does not come back.  Follow up in 2-3 days if not improving, sooner if significantly worsening.

## 2020-08-25 LAB — WOUND CULTURE
MICRO NUMBER:: 11202716
SPECIMEN QUALITY:: ADEQUATE

## 2020-09-06 ENCOUNTER — Encounter: Payer: Self-pay | Admitting: Emergency Medicine

## 2020-09-06 ENCOUNTER — Other Ambulatory Visit: Payer: Self-pay

## 2020-09-06 ENCOUNTER — Emergency Department (INDEPENDENT_AMBULATORY_CARE_PROVIDER_SITE_OTHER)
Admission: EM | Admit: 2020-09-06 | Discharge: 2020-09-06 | Disposition: A | Discharge status: Home / Self Care | Payer: Managed Care, Other (non HMO) | Source: Home / Self Care

## 2020-09-06 ENCOUNTER — Emergency Department (INDEPENDENT_AMBULATORY_CARE_PROVIDER_SITE_OTHER): Payer: Managed Care, Other (non HMO)

## 2020-09-06 DIAGNOSIS — R509 Fever, unspecified: Secondary | ICD-10-CM | POA: Diagnosis not present

## 2020-09-06 DIAGNOSIS — J069 Acute upper respiratory infection, unspecified: Secondary | ICD-10-CM | POA: Diagnosis not present

## 2020-09-06 DIAGNOSIS — R059 Cough, unspecified: Secondary | ICD-10-CM | POA: Diagnosis not present

## 2020-09-06 NOTE — Discharge Instructions (Signed)
  You may take 500mg acetaminophen every 4-6 hours or in combination with ibuprofen 400-600mg every 6-8 hours as needed for pain, inflammation, and fever.  Be sure to well hydrated with clear liquids and get at least 8 hours of sleep at night, preferably more while sick.   Please follow up with family medicine in 1 week if needed.   

## 2020-09-06 NOTE — ED Triage Notes (Signed)
Fever, headache, cough started yesterday. Unvaccinated

## 2020-09-06 NOTE — ED Provider Notes (Signed)
[name]    CSN: [id number] Arrival date & time: 09/06/20  1133      History   Chief Complaint Chief Complaint  Patient presents with   Fever    HPI Marc Horton is a 54 y.o. male.   HPI  Marc Horton is a 54 y.o. male presenting to UC with c/o fever Tmax 101*F, generalized HA, mildly productive cough, chills, and sweats last night. He has taken tylenol and mucinex.  No fever this morning.  Denies chest pain or SOB. No n/v/d. No hx of asthma or COPD. No known sick contacts. He has not been vaccinated for flu or COVID. Hx of pneumonia in the past.    Past Medical History:  Diagnosis Date   Hypercholesteremia    Pneumonia     Patient Active Problem List   Diagnosis Date Noted   Arthritis of knee, degenerative 11/22/2016   HTN (hypertension) 11/11/2015   Morbid obesity (HCC) 11/11/2015   Tachycardia 11/11/2015   Low libido 11/11/2015    Past Surgical History:  Procedure Laterality Date   APPENDECTOMY     KNEE ARTHROSCOPY Right        Home Medications    Prior to Admission medications   Medication Sig Start Date End Date Taking? Authorizing Provider  Acetaminophen (TYLENOL PO) Take by mouth.    [provider]  lisinopril (PRINIVIL,ZESTRIL) 10 MG tablet Take 1 tablet (10 mg total) by mouth daily. 01/24/18   Rodolph Bong, MD    Family History Family History  Problem Relation Age of Onset   Diabetes Mother    Heart disease Mother    Heart failure Mother    Heart disease Father    Heart failure Father     Social History Social History   Tobacco Use   Smoking status: Never Smoker   Smokeless tobacco: Never Used  Vaping Use   Vaping Use: Never used  Substance Use Topics   Alcohol use: No   Drug use: No     Allergies   Patient has no known allergies.   Review of Systems Review of Systems  Constitutional: Positive for chills, diaphoresis and fever.  HENT: Positive for congestion. Negative  for ear pain, sore throat, trouble swallowing and voice change.   Respiratory: Positive for cough. Negative for shortness of breath.   Cardiovascular: Negative for chest pain and palpitations.  Gastrointestinal: Negative for abdominal pain, diarrhea, nausea and vomiting.  Musculoskeletal: Positive for arthralgias and myalgias. Negative for back pain.       Body aches  Skin: Negative for rash.  Neurological: Positive for headaches. Negative for dizziness and light-headedness.  All other systems reviewed and are negative.    Physical Exam Triage Vital Signs ED Triage Vitals  Enc Vitals Group     BP 09/06/20 1148 (!) 152/92     Pulse Rate 09/06/20 1148 85     Resp --      Temp 09/06/20 1148 98.4 F (36.9 C)     Temp Source 09/06/20 1148 Oral     SpO2 09/06/20 1148 94 %     Weight 09/06/20 1149 265 lb (120.2 kg)     Height 09/06/20 1149 6' (1.829 m)     Head Circumference --      Peak Flow --      Pain Score 09/06/20 1148 0     Pain Loc --      Pain Edu? --      Excl. in  GC? --    No data found.  Updated Vital Signs BP (!) 152/92 (BP Location: Right Arm)    Pulse 85    Temp 98.4 F (36.9 C) (Oral)    Ht 6' (1.829 m)    Wt 265 lb (120.2 kg)    SpO2 94%    BMI 35.94 kg/m   Visual Acuity Right Eye Distance:   Left Eye Distance:   Bilateral Distance:    Right Eye Near:   Left Eye Near:    Bilateral Near:     Physical Exam Vitals and nursing note reviewed.  Constitutional:      General: He is not in acute distress.    Appearance: Normal appearance. He is well-developed. He is obese. He is not ill-appearing, toxic-appearing or diaphoretic.  HENT:     Head: Normocephalic and atraumatic.     Right Ear: Tympanic membrane and ear canal normal.     Left Ear: Tympanic membrane and ear canal normal.     Nose: Nose normal.     Right Sinus: No maxillary sinus tenderness or frontal sinus tenderness.     Left Sinus: No maxillary sinus tenderness or frontal sinus tenderness.      Mouth/Throat:     Lips: Pink.     Mouth: Mucous membranes are moist.     Pharynx: Oropharynx is clear. Uvula midline. No pharyngeal swelling, oropharyngeal exudate, posterior oropharyngeal erythema or uvula swelling.  Eyes:     General: No scleral icterus.    Conjunctiva/sclera: Conjunctivae normal.  Cardiovascular:     Rate and Rhythm: Normal rate and regular rhythm.     Heart sounds: Normal heart sounds.  Pulmonary:     Effort: Pulmonary effort is normal. No respiratory distress.     Breath sounds: Normal breath sounds. No stridor. No wheezing, rhonchi or rales.  Abdominal:     General: There is no distension.     Palpations: Abdomen is soft.     Tenderness: There is no abdominal tenderness.  Musculoskeletal:        General: Normal range of motion.     Cervical back: Normal range of motion and neck supple. No tenderness.  Lymphadenopathy:     Cervical: No cervical adenopathy.  Skin:    General: Skin is warm and dry.  Neurological:     Mental Status: He is alert.      UC Treatments / Results  Labs (all labs ordered are listed, but only abnormal results are displayed) Labs Reviewed  COVID-19, FLU A+B AND RSV    EKG   Radiology DG Chest 2 View  Result Date: 09/06/2020 CLINICAL DATA:  Cough, congestion and fever since yesterday. EXAM: CHEST - 2 VIEW COMPARISON:  10/23/2017 FINDINGS: Heart, mediastinum and hila are unremarkable. Clear lungs.  No pleural effusion or pneumothorax. Skeletal structures are intact IMPRESSION: No active cardiopulmonary disease. Electronically Signed   By: Amie Portland M.D.   On: 09/06/2020 12:26    Procedures Procedures (including critical care time)  Medications Ordered in UC Medications - No data to display  Initial Impression / Assessment and Plan / UC Course  I have reviewed the triage vital signs and the nursing notes.  Pertinent labs & imaging results that were available during my care of the patient were reviewed by me and  considered in my medical decision making (see chart for details).     Lungs: CTAB, however, due to O2 Sat 94%, reported fever and hx of pneumonia, CXR ordered to r/o  early pneumonia or bronchitis. CXR: WNL COVID/RSV/Flu test pending F/u with PCP as needed  Final Clinical Impressions(s) / UC Diagnoses   Final diagnoses:  Acute upper respiratory infection     Discharge Instructions      You may take 500mg  acetaminophen every 4-6 hours or in combination with ibuprofen 400-600mg  every 6-8 hours as needed for pain, inflammation, and fever.  Be sure to well hydrated with clear liquids and get at least 8 hours of sleep at night, preferably more while sick.   Please follow up with family medicine in 1 week if needed.     ED Prescriptions    None     PDMP not reviewed this encounter.   , PA-C 09/06/20 1239

## 2020-09-08 LAB — COVID-19, FLU A+B AND RSV
Influenza A, NAA: NOT DETECTED
Influenza B, NAA: NOT DETECTED
RSV, NAA: NOT DETECTED
SARS-CoV-2, NAA: DETECTED — AB

## 2020-09-12 ENCOUNTER — Encounter: Payer: Self-pay | Admitting: Emergency Medicine

## 2020-09-12 ENCOUNTER — Emergency Department (INDEPENDENT_AMBULATORY_CARE_PROVIDER_SITE_OTHER)
Admission: EM | Admit: 2020-09-12 | Discharge: 2020-09-12 | Disposition: A | Discharge status: Home / Self Care | Payer: Managed Care, Other (non HMO) | Source: Home / Self Care

## 2020-09-12 ENCOUNTER — Other Ambulatory Visit: Payer: Self-pay

## 2020-09-12 DIAGNOSIS — U071 COVID-19: Secondary | ICD-10-CM | POA: Diagnosis not present

## 2020-09-12 DIAGNOSIS — R059 Cough, unspecified: Secondary | ICD-10-CM

## 2020-09-12 DIAGNOSIS — J069 Acute upper respiratory infection, unspecified: Secondary | ICD-10-CM

## 2020-09-12 HISTORY — DX: COVID-19: U07.1

## 2020-09-12 MED ORDER — PREDNISONE 20 MG PO TABS: 40.0000 mg | ORAL_TABLET | Freq: Every day | ORAL | 0 refills | Status: DC

## 2020-09-12 MED ORDER — PROMETHAZINE-CODEINE 6.25-10 MG/5ML PO SYRP
5.0000 mL | ORAL_SOLUTION | Freq: Three times a day (TID) | ORAL | 0 refills | Status: DC | PRN
Start: 2020-09-12 — End: 2024-08-31

## 2020-09-12 MED ORDER — DEXAMETHASONE SODIUM PHOSPHATE 10 MG/ML IJ SOLN
10.0000 mg | Freq: Once | INTRAMUSCULAR | Status: AC
  Administered 2020-09-12: 10 mg via INTRAMUSCULAR

## 2020-09-12 MED ORDER — BENZONATATE 100 MG PO CAPS: 200.0000 mg | ORAL_CAPSULE | Freq: Three times a day (TID) | ORAL | 0 refills | Status: DC | PRN

## 2020-09-12 NOTE — ED Provider Notes (Signed)
[facility]    CSN: [id number] Arrival date & time: 09/12/20  1419      History   Chief Complaint No chief complaint on file.   HPI Marc Horton is a 54 y.o. male.   HPI  COVID-19 positive 09/08/20. He is here today for worsening cough and shortness of breath with activity. Afebrile.  Cough is nonproductive . He has been taking OTC medication without relief    Past Medical History:  Diagnosis Date  . Hypercholesteremia   . Pneumonia     Patient Active Problem List   Diagnosis Date Noted  . Arthritis of knee, degenerative 11/22/2016  . HTN (hypertension) 11/11/2015  . Morbid obesity (HCC) 11/11/2015  . Tachycardia 11/11/2015  . Low libido 11/11/2015    Past Surgical History:  Procedure Laterality Date  . APPENDECTOMY    . KNEE ARTHROSCOPY Right        Home Medications    Prior to Admission medications   Medication Sig Start Date End Date Taking? Authorizing Provider  Acetaminophen (TYLENOL PO) Take by mouth.    [provider]  lisinopril (PRINIVIL,ZESTRIL) 10 MG tablet Take 1 tablet (10 mg total) by mouth daily. 01/24/18   Rodolph Bong, MD    Family History Family History  Problem Relation Age of Onset  . Diabetes Mother   . Heart disease Mother   . Heart failure Mother   . Heart disease Father   . Heart failure Father     Social History Social History   Tobacco Use  . Smoking status: Never Smoker  . Smokeless tobacco: Never Used  Vaping Use  . Vaping Use: Never used  Substance Use Topics  . Alcohol use: No  . Drug use: No     Allergies   Patient has no known allergies.   Review of Systems Review of Systems Pertinent negatives listed in HPI   Physical Exam Triage Vital Signs ED Triage Vitals  Enc Vitals Group     BP      Pulse      Resp      Temp      Temp src      SpO2      Weight      Height      Head Circumference      Peak Flow      Pain Score      Pain Loc      Pain Edu?      Excl. in  GC?    No data found.  Updated Vital Signs There were no vitals taken for this visit.  Visual Acuity Right Eye Distance:   Left Eye Distance:   Bilateral Distance:    Right Eye Near:   Left Eye Near:    Bilateral Near:     Physical Exam General appearance: alert, Ill-appearing, no distress Head: Normocephalic, without obvious abnormality, atraumatic ENT: mucosal edema, congestion, erythematous oropharynx w/o exudate Respiratory: Respirations even , unlabored, coarse lung sound, expiratory wheeze Heart: rate and rhythm normal. No gallop or murmurs noted on exam  Abdomen: BS +, no distention, no rebound tenderness, or no mass Extremities: No gross deformities Skin: Skin color, texture, turgor normal. No rashes seen  Psych: Appropriate mood and affect. Neurologic: Mental status: Alert, oriented to person, place, and time, thought content appropriate.  UC Treatments / Results  Labs (all labs ordered are listed, but only abnormal results are displayed) Labs Reviewed - No data to display  EKG  Radiology No results found.  Procedures Procedures (including critical care time)  Medications Ordered in UC Medications - No data to display  Initial Impression / Assessment and Plan / UC Course  I have reviewed the triage vital signs and the nursing notes.  Pertinent labs & imaging results that were available during my care of the patient were reviewed by me and considered in my medical decision making (see chart for details).    COVID19 infection with secondary URI Treatment per discharge orders Strict ER precaution given  Work note provided indicating return date based on quarantine criteria  Final Clinical Impressions(s) / UC Diagnoses   Final diagnoses:  Upper respiratory tract infection due to COVID-19 virus  Cough     Discharge Instructions     Start oral prednisone tomorrow and take as directed.  He did receive a steroid shot while here in clinic today.  For  cough management I have prescribed promethazine with codeine cough syrup can make you drowsy therefore do not operate a motor vehicle while taking medication is most appropriately taken prior to bedtime to help with nighttime coughing.  During the day you can take benzonatate 200 mg 3 times daily as needed for cough this medication does not cause drowsiness.  If you develop any severe shortness of breath, chest tightness, or lower leg swelling and pain go immediately to the emergency department.    ED Prescriptions    Medication Sig Dispense Auth. Provider   predniSONE (DELTASONE) 20 MG tablet Take 2 tablets (40 mg total) by mouth daily with breakfast. 10 tablet Bing Neighbors, FNP   benzonatate (TESSALON) 100 MG capsule Take 2 capsules (200 mg total) by mouth 3 (three) times daily as needed for cough. 40 capsule Bing Neighbors, FNP   promethazine-codeine (PHENERGAN WITH CODEINE) 6.25-10 MG/5ML syrup Take 5 mLs by mouth every 8 (eight) hours as needed for cough (May cause severe drowsiness). 120 mL Bing Neighbors, FNP     PDMP not reviewed this encounter.   Bing Neighbors, FNP 09/16/20 2211

## 2020-09-12 NOTE — ED Triage Notes (Addendum)
Patient was seen here 6 days ago and tested positive for covid; since then cough has worsened and he cannot get any rest; taking OTC cough medicine without relief. Did have monoclonal treatment 5 days ago.

## 2020-09-12 NOTE — Discharge Instructions (Addendum)
Start oral prednisone tomorrow and take as directed.  He did receive a steroid shot while here in clinic today.  For cough management I have prescribed promethazine with codeine cough syrup can make you drowsy therefore do not operate a motor vehicle while taking medication is most appropriately taken prior to bedtime to help with nighttime coughing.  During the day you can take benzonatate 200 mg 3 times daily as needed for cough this medication does not cause drowsiness.  If you develop any severe shortness of breath, chest tightness, or lower leg swelling and pain go immediately to the emergency department.

## 2021-03-06 ENCOUNTER — Other Ambulatory Visit: Payer: Self-pay

## 2021-03-06 ENCOUNTER — Emergency Department (INDEPENDENT_AMBULATORY_CARE_PROVIDER_SITE_OTHER)
Admission: EM | Admit: 2021-03-06 | Discharge: 2021-03-06 | Disposition: A | Discharge status: Home / Self Care | Payer: Managed Care, Other (non HMO) | Source: Home / Self Care

## 2021-03-06 ENCOUNTER — Encounter: Payer: Self-pay | Admitting: Emergency Medicine

## 2021-03-06 DIAGNOSIS — J0100 Acute maxillary sinusitis, unspecified: Secondary | ICD-10-CM | POA: Diagnosis not present

## 2021-03-06 DIAGNOSIS — J309 Allergic rhinitis, unspecified: Secondary | ICD-10-CM

## 2021-03-06 DIAGNOSIS — B9689 Other specified bacterial agents as the cause of diseases classified elsewhere: Secondary | ICD-10-CM | POA: Diagnosis not present

## 2021-03-06 DIAGNOSIS — H109 Unspecified conjunctivitis: Secondary | ICD-10-CM | POA: Diagnosis not present

## 2021-03-06 DIAGNOSIS — J3489 Other specified disorders of nose and nasal sinuses: Secondary | ICD-10-CM | POA: Diagnosis not present

## 2021-03-06 MED ORDER — AMOXICILLIN-POT CLAVULANATE 875-125 MG PO TABS: 1.0000 | ORAL_TABLET | Freq: Two times a day (BID) | ORAL | 0 refills | Status: DC

## 2021-03-06 MED ORDER — SULFACETAMIDE SODIUM 10 % OP SOLN
1.0000 [drp] | Freq: Three times a day (TID) | OPHTHALMIC | 0 refills | Status: AC
Start: 2021-03-06 — End: 2021-03-11

## 2021-03-06 MED ORDER — METHYLPREDNISOLONE SODIUM SUCC 125 MG IJ SOLR
125.0000 mg | Freq: Once | INTRAMUSCULAR | Status: AC
  Administered 2021-03-06: 125 mg via INTRAMUSCULAR

## 2021-03-06 MED ORDER — PREDNISONE 20 MG PO TABS: ORAL_TABLET | ORAL | 0 refills | Status: DC

## 2021-03-06 NOTE — ED Triage Notes (Signed)
Patient c/o sinus pressure and pain since Wednesday.  Friday he awoke with his eyes matted together, drainage from eyes, swollen and itchy.  Patient is not vaccinated for COVID.  Patient has been taking Allegra D.

## 2021-03-06 NOTE — Discharge Instructions (Signed)
Advised patient to take medications as directed with food to completion.  Advised patient to discontinue his current Allegra D start regular Allegra 180 mg for the next 5 days then as needed. Instructed patient not to start oral prednisone burst until tomorrow morning, Monday, 03/08/2021 with first dose of antibiotic.

## 2021-03-06 NOTE — ED Provider Notes (Signed)
[facility]    CSN: [id number] Arrival date & time: 03/06/21  1313      History   Chief Complaint Chief Complaint  Patient presents with  . Facial Pain    HPI Marc Horton is a 55 y.o. male.   HPI 55 year old male presents with sinus pressure and sinus nasal congestion for 5 days.  Patient reports awakening Friday morning feeling eyelids matted together with drainage from eyes bilaterally.  Past Medical History:  Diagnosis Date  . COVID   . Hypercholesteremia   . Pneumonia     Patient Active Problem List   Diagnosis Date Noted  . Arthritis of knee, degenerative 11/22/2016  . HTN (hypertension) 11/11/2015  . Morbid obesity (HCC) 11/11/2015  . Tachycardia 11/11/2015  . Low libido 11/11/2015    Past Surgical History:  Procedure Laterality Date  . APPENDECTOMY    . KNEE ARTHROSCOPY Right        Home Medications    Prior to Admission medications   Medication Sig Start Date End Date Taking? Authorizing Provider  Acetaminophen (TYLENOL PO) Take by mouth.   Yes [provider]  amoxicillin-clavulanate (AUGMENTIN) 875-125 MG tablet Take 1 tablet by mouth every 12 (twelve) hours. 03/06/21  Yes Trevor Iha, FNP  predniSONE (DELTASONE) 20 MG tablet Take 3 tabs PO daily x 5 days. 03/06/21  Yes Trevor Iha, FNP  sulfacetamide (BLEPH-10) 10 % ophthalmic solution Place 1-2 drops into both eyes in the morning, at noon, and at bedtime for 5 days. 03/06/21 03/11/21 Yes Trevor Iha, FNP  benzonatate (TESSALON) 100 MG capsule Take 2 capsules (200 mg total) by mouth 3 (three) times daily as needed for cough. 09/12/20   Bing Neighbors, FNP  lisinopril (PRINIVIL,ZESTRIL) 10 MG tablet Take 1 tablet (10 mg total) by mouth daily. 01/24/18   Rodolph Bong, MD  promethazine-codeine (PHENERGAN WITH CODEINE) 6.25-10 MG/5ML syrup Take 5 mLs by mouth every 8 (eight) hours as needed for cough (May cause severe drowsiness). 09/12/20   Bing Neighbors, FNP     Family History Family History  Problem Relation Age of Onset  . Diabetes Mother   . Heart disease Mother   . Heart failure Mother   . Heart disease Father   . Heart failure Father     Social History Social History   Tobacco Use  . Smoking status: Never Smoker  . Smokeless tobacco: Never Used  Vaping Use  . Vaping Use: Never used  Substance Use Topics  . Alcohol use: No  . Drug use: No     Allergies   Patient has no known allergies.   Review of Systems Review of Systems  Constitutional: Negative.   HENT: Positive for congestion, ear pain, postnasal drip, sinus pressure and sinus pain.   Eyes: Positive for discharge, redness and itching.  Respiratory: Negative.   Cardiovascular: Negative.   Gastrointestinal: Negative.   Genitourinary: Negative.   Musculoskeletal: Negative.   Skin: Negative.   Neurological: Negative.      Physical Exam Triage Vital Signs ED Triage Vitals [03/06/21 1406]  Enc Vitals Group     BP (!) 148/83     Pulse Rate 78     Resp      Temp 98.6 F (37 C)     Temp Source Oral     SpO2 97 %     Weight      Height      Head Circumference      Peak Flow  Pain Score 5     Pain Loc      Pain Edu?      Excl. in GC?    No data found.  Updated Vital Signs BP (!) 148/83 (BP Location: Left Arm)   Pulse 78   Temp 98.6 F (37 C) (Oral)   SpO2 97%    Physical Exam Vitals and nursing note reviewed.  Constitutional:      General: He is not in acute distress.    Appearance: Normal appearance. He is obese. He is ill-appearing.  HENT:     Head: Normocephalic and atraumatic.     Right Ear: Tympanic membrane and ear canal normal.     Left Ear: Tympanic membrane and ear canal normal.     Nose: Nose normal.     Mouth/Throat:     Mouth: Mucous membranes are moist.     Pharynx: Oropharynx is clear.     Comments: Moderate amount of clear drainage of posterior oropharynx noted Eyes:     Extraocular Movements: Extraocular movements  intact.     Pupils: Pupils are equal, round, and reactive to light.     Comments: Bilateral conjunctivae +3 injection, minimal dried mucopurulent discharge of inner canthus is noted  Cardiovascular:     Rate and Rhythm: Normal rate and regular rhythm.     Pulses: Normal pulses.     Heart sounds: Normal heart sounds.  Pulmonary:     Effort: Pulmonary effort is normal.     Breath sounds: Normal breath sounds.     Comments: No adventitious breath sounds noted Musculoskeletal:        General: Normal range of motion.     Cervical back: Normal range of motion and neck supple. No tenderness.  Lymphadenopathy:     Cervical: No cervical adenopathy.  Skin:    General: Skin is warm and dry.  Neurological:     General: No focal deficit present.     Mental Status: He is alert and oriented to person, place, and time.  Psychiatric:        Mood and Affect: Mood normal.        Behavior: Behavior normal.      UC Treatments / Results  Labs (all labs ordered are listed, but only abnormal results are displayed) Labs Reviewed - No data to display  EKG   Radiology No results found.  Procedures Procedures (including critical care time)  Medications Ordered in UC Medications  methylPREDNISolone sodium succinate (SOLU-MEDROL) 125 mg/2 mL injection 125 mg (125 mg Intramuscular Given 03/06/21 1429)    Initial Impression / Assessment and Plan / UC Course  I have reviewed the triage vital signs and the nursing notes.  Pertinent labs & imaging results that were available during my care of the patient were reviewed by me and considered in my medical decision making (see chart for details).     MDM: 1.  Bacterial conjunctivitis of both eyes, 2.  Acute maxillary sinusitis, 3.  Sinus pressure, 4.  Allergic rhinitis.  Patient discharged home, hemodynamically stable. Final Clinical Impressions(s) / UC Diagnoses   Final diagnoses:  Bacterial conjunctivitis of both eyes  Acute maxillary sinusitis,  recurrence not specified  Sinus pressure  Allergic rhinitis, unspecified seasonality, unspecified trigger     Discharge Instructions     Advised patient to take medications as directed with food to completion.  Advised patient to discontinue his current Allegra D start regular Allegra 180 mg for the next 5 days then as  needed. Instructed patient not to start oral prednisone burst until tomorrow morning, Monday, 03/08/2021 with first dose of antibiotic.    ED Prescriptions    Medication Sig Dispense Auth. Provider   amoxicillin-clavulanate (AUGMENTIN) 875-125 MG tablet Take 1 tablet by mouth every 12 (twelve) hours. 14 tablet Trevor Iha, FNP   predniSONE (DELTASONE) 20 MG tablet Take 3 tabs PO daily x 5 days. 15 tablet Trevor Iha, FNP   sulfacetamide (BLEPH-10) 10 % ophthalmic solution Place 1-2 drops into both eyes in the morning, at noon, and at bedtime for 5 days. 5 mL Trevor Iha, FNP     PDMP not reviewed this encounter.   Trevor Iha, FNP 03/06/21 1742

## 2022-06-30 IMAGING — DX DG CHEST 2V
2 series · 2 of 2 positions shown · non-contrast
Comparison: 10/23/2017

CLINICAL DATA: Cough, congestion and fever since yesterday.

EXAM:
CHEST - 2 VIEW

[chest pa]
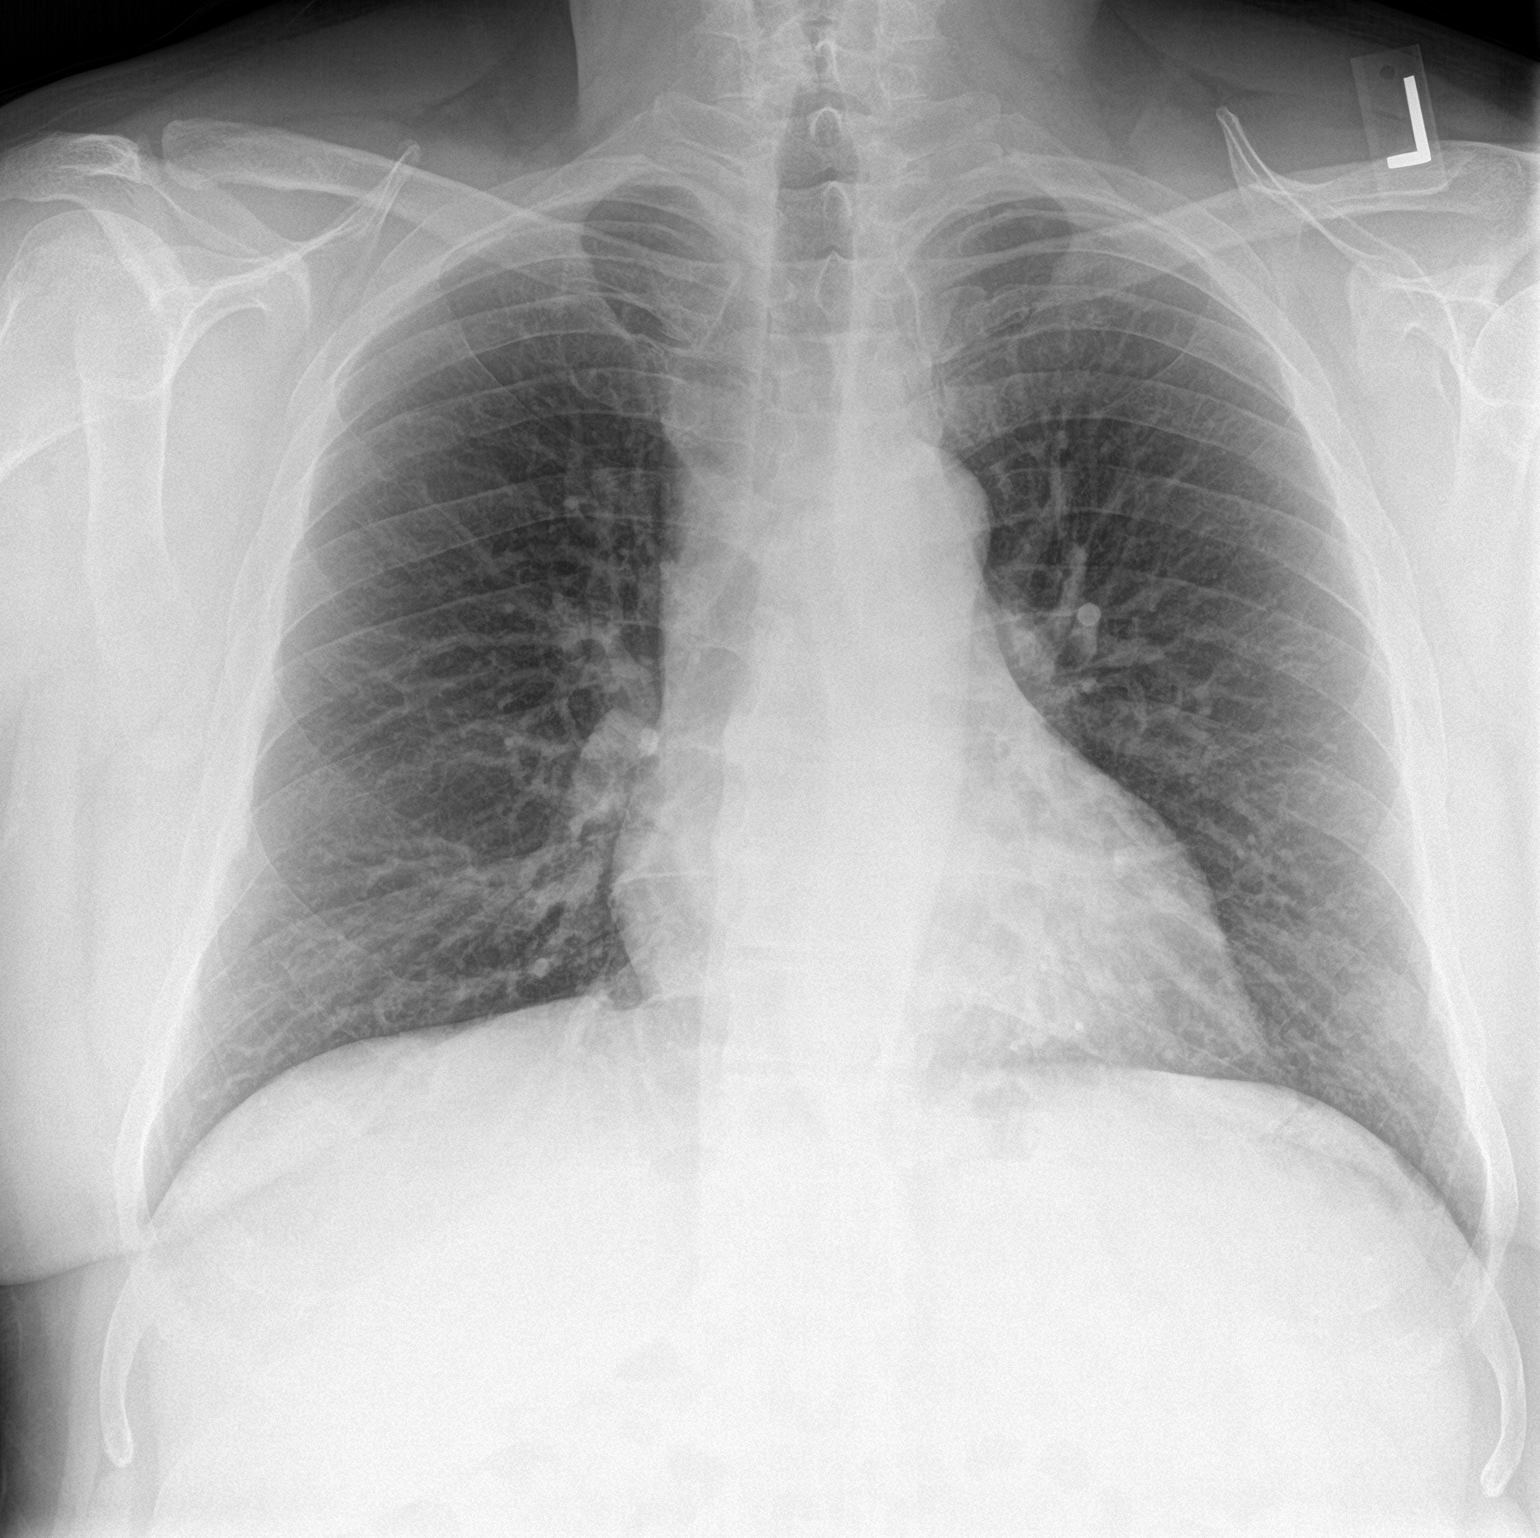

[chest lat]
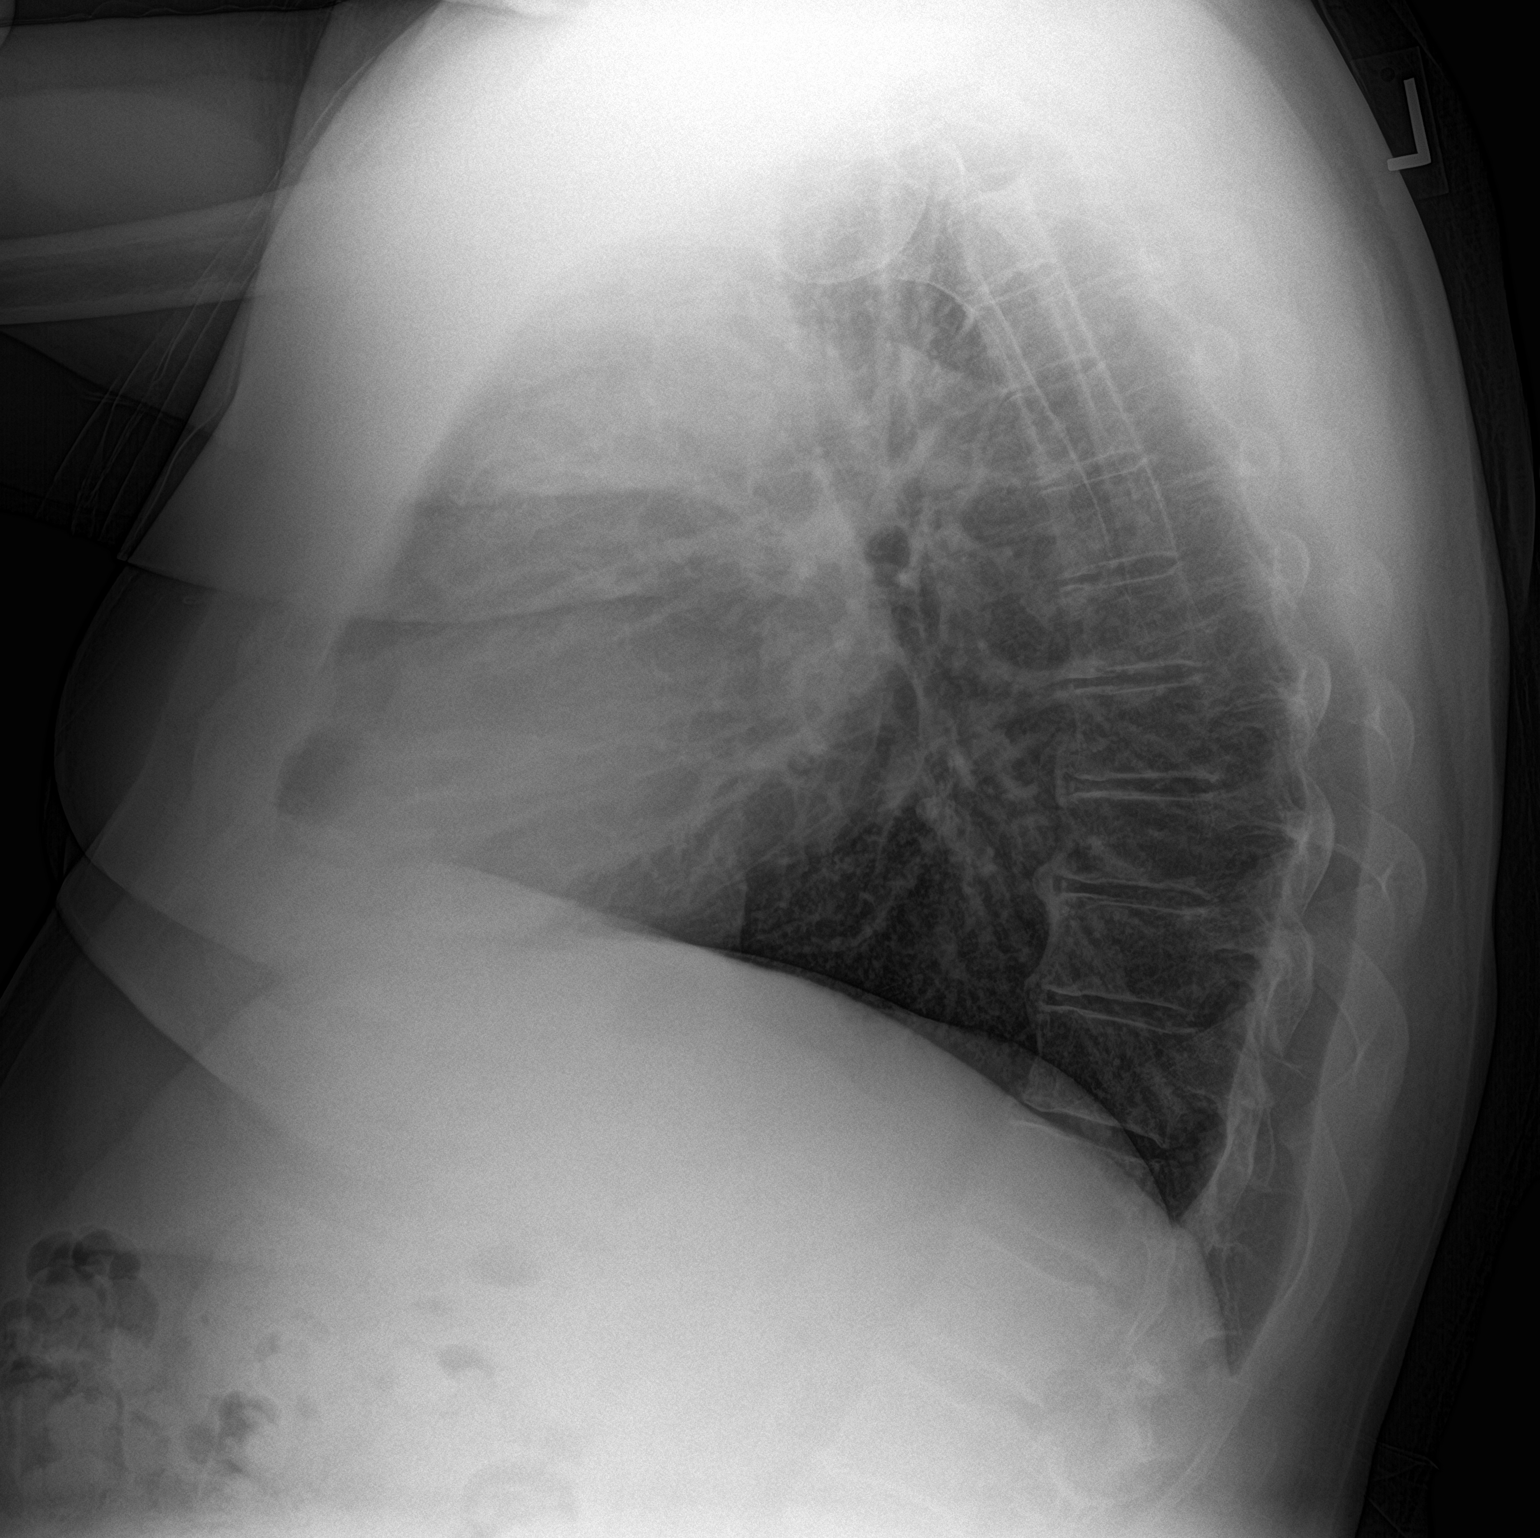

[2 of 2 positions shown; findings below may reference images not displayed]

FINDINGS: Heart, mediastinum and hila are unremarkable.

Clear lungs.  No pleural effusion or pneumothorax.

Skeletal structures are intact
IMPRESSION: No active cardiopulmonary disease.

## 2024-04-29 ENCOUNTER — Emergency Department (HOSPITAL_BASED_OUTPATIENT_CLINIC_OR_DEPARTMENT_OTHER)
Admission: EM | Admit: 2024-04-29 | Discharge: 2024-04-29 | Disposition: A | Discharge status: Home / Self Care | Attending: Emergency Medicine | Admitting: Emergency Medicine

## 2024-04-29 ENCOUNTER — Other Ambulatory Visit: Payer: Self-pay

## 2024-04-29 ENCOUNTER — Encounter (HOSPITAL_BASED_OUTPATIENT_CLINIC_OR_DEPARTMENT_OTHER): Payer: Self-pay | Admitting: Radiology

## 2024-04-29 DIAGNOSIS — T18128A Food in esophagus causing other injury, initial encounter: Secondary | ICD-10-CM | POA: Insufficient documentation

## 2024-04-29 DIAGNOSIS — W44F3XA Food entering into or through a natural orifice, initial encounter: Secondary | ICD-10-CM | POA: Insufficient documentation

## 2024-04-29 DIAGNOSIS — R1110 Vomiting, unspecified: Secondary | ICD-10-CM | POA: Diagnosis present

## 2024-04-29 LAB — CBC WITH DIFFERENTIAL/PLATELET
Abs Immature Granulocytes: 0.03 K/uL (ref 0.00–0.07)
Basophils Absolute: 0 K/uL (ref 0.0–0.1)
Basophils Relative: 1 %
Eosinophils Absolute: 0.1 K/uL (ref 0.0–0.5)
Eosinophils Relative: 1 %
HCT: 46.3 % (ref 39.0–52.0)
Hemoglobin: 15.9 g/dL (ref 13.0–17.0)
Immature Granulocytes: 0 %
Lymphocytes Relative: 16 %
Lymphs Abs: 1.4 K/uL (ref 0.7–4.0)
MCH: 28 pg (ref 26.0–34.0)
MCHC: 34.3 g/dL (ref 30.0–36.0)
MCV: 81.7 fL (ref 80.0–100.0)
Monocytes Absolute: 0.5 K/uL (ref 0.1–1.0)
Monocytes Relative: 6 %
Neutro Abs: 6.5 K/uL (ref 1.7–7.7)
Neutrophils Relative %: 76 %
Platelets: 251 K/uL (ref 150–400)
RBC: 5.67 MIL/uL (ref 4.22–5.81)
RDW: 13.1 % (ref 11.5–15.5)
WBC: 8.5 K/uL (ref 4.0–10.5)
nRBC: 0 % (ref 0.0–0.2)

## 2024-04-29 LAB — COMPREHENSIVE METABOLIC PANEL WITH GFR
ALT: 22 U/L (ref 0–44)
AST: 26 U/L (ref 15–41)
Albumin: 4.6 g/dL (ref 3.5–5.0)
Alkaline Phosphatase: 91 U/L (ref 38–126)
Anion gap: 14 (ref 5–15)
BUN: 13 mg/dL (ref 6–20)
CO2: 22 mmol/L (ref 22–32)
Calcium: 9.4 mg/dL (ref 8.9–10.3)
Chloride: 105 mmol/L (ref 98–111)
Creatinine, Ser: 0.73 mg/dL (ref 0.61–1.24)
GFR, Estimated: >60 mL/min (ref >60)
Glucose, Bld: 103 mg/dL — ABNORMAL HIGH (ref 70–99)
Potassium: 3.9 mmol/L (ref 3.5–5.1)
Sodium: 141 mmol/L (ref 135–145)
Total Bilirubin: 0.8 mg/dL (ref 0.0–1.2)
Total Protein: 7 g/dL (ref 6.5–8.1)

## 2024-04-29 LAB — LIPASE, BLOOD: Lipase: 19 U/L (ref 11–51)

## 2024-04-29 MED ORDER — ACETAMINOPHEN 500 MG PO TABS
1000.0000 mg | ORAL_TABLET | Freq: Once | ORAL | Status: AC
  Administered 2024-04-29: 1000 mg via ORAL
  Filled 2024-04-29: qty 2

## 2024-04-29 MED ORDER — SODIUM CHLORIDE 0.9 % IV BOLUS
1000.0000 mL | Freq: Once | INTRAVENOUS | Status: AC
  Administered 2024-04-29: 1000 mL via INTRAVENOUS

## 2024-04-29 MED ORDER — PANTOPRAZOLE SODIUM 40 MG IV SOLR
40.0000 mg | Freq: Once | INTRAVENOUS | Status: AC
  Administered 2024-04-29: 40 mg via INTRAVENOUS
  Filled 2024-04-29: qty 10

## 2024-04-29 NOTE — Discharge Instructions (Signed)
 Make sure you chew your food really well before swallowing.  Make an appointment to follow-up with a gastroenterologist.  Return to the emergency room if you have any worsening symptoms.

## 2024-04-29 NOTE — ED Provider Notes (Signed)
 [place] [facility]ERGENCY DEPARTMENT AT [facility] Provider Note   CSN: [id number] Arrival date & time: 04/29/24  1746     Patient presents with: Emesis   Marc Horton is a 58 y.o. male.   Patient is a 58 year old male who presents with possible food stuck in his esophagus.  He states he was eating [employer] for lunch this morning at about 1130 and felt like some chicken got stuck in his throat.  He points to his epigastric area.  He said since that time he kept spitting back everything he tried to eat or drink.  He has not really been vomiting stomach acid type stuff but more spitting up mucousy clear liquid.  He has not had any abdominal pain.  He has acid feeling in his esophagus.  He said he has had this happen before but usually after a period of time it will go down and he will be able to continue to eat.  He has not been able to eat or drink anything since it started.  He does not have a gastroenterologist.  He currently has a headache.  He thinks it may be from not eating or drinking.       Prior to Admission medications   Medication Sig Start Date End Date Taking? Authorizing Provider  Acetaminophen  (TYLENOL  PO) Take by mouth.    [provider]  amoxicillin -clavulanate (AUGMENTIN ) 875-125 MG tablet Take 1 tablet by mouth every 12 (twelve) hours. 03/06/21   Ragan, Michael, FNP  benzonatate  (TESSALON ) 100 MG capsule Take 2 capsules (200 mg total) by mouth 3 (three) times daily as needed for cough. 09/12/20   Arloa Suzen RAMAN, NP  lisinopril  (PRINIVIL ,ZESTRIL ) 10 MG tablet Take 1 tablet (10 mg total) by mouth daily. 01/24/18   Joane Artist RAMAN, MD  predniSONE  (DELTASONE ) 20 MG tablet Take 3 tabs PO daily x 5 days. 03/06/21   Teddy Sharper, FNP  promethazine -codeine  (PHENERGAN  WITH CODEINE ) 6.25-10 MG/5ML syrup Take 5 mLs by mouth every 8 (eight) hours as needed for cough (May cause severe drowsiness). 09/12/20   Arloa Suzen RAMAN, NP    Allergies: Patient has no  known allergies.    Review of Systems  Constitutional:  Negative for chills, diaphoresis, fatigue and fever.  HENT:  Negative for congestion, rhinorrhea and sneezing.   Eyes: Negative.   Respiratory:  Negative for cough, chest tightness and shortness of breath.   Cardiovascular:  Negative for chest pain and leg swelling.  Gastrointestinal:  Positive for nausea and vomiting. Negative for abdominal pain, blood in stool and diarrhea.  Genitourinary:  Negative for frequency and hematuria.  Musculoskeletal:  Negative for back pain.  Skin:  Negative for rash.  Neurological:  Positive for headaches. Negative for dizziness, speech difficulty, weakness and numbness.    Updated Vital Signs BP (!) 147/75 (BP Location: Right Arm)   Pulse 72   Temp 98.2 F (36.8 C) (Oral)   Resp 18   Ht 6' (1.829 m)   Wt 117.9 kg   SpO2 98%   BMI 35.26 kg/m   Physical Exam Constitutional:      Appearance: He is well-developed.  HENT:     Head: Normocephalic and atraumatic.  Eyes:     Pupils: Pupils are equal, round, and reactive to light.  Cardiovascular:     Rate and Rhythm: Normal rate and regular rhythm.     Heart sounds: Normal heart sounds.  Pulmonary:     Effort: Pulmonary effort is normal. No  respiratory distress.     Breath sounds: Normal breath sounds. No wheezing or rales.  Chest:     Chest wall: No tenderness.  Abdominal:     General: Bowel sounds are normal.     Palpations: Abdomen is soft.     Tenderness: There is no abdominal tenderness. There is no guarding or rebound.  Musculoskeletal:        General: Normal range of motion.     Cervical back: Normal range of motion and neck supple.  Lymphadenopathy:     Cervical: No cervical adenopathy.  Skin:    General: Skin is warm and dry.     Findings: No rash.  Neurological:     Mental Status: He is alert and oriented to person, place, and time.     (all labs ordered are listed, but only abnormal results are displayed) Labs  Reviewed  COMPREHENSIVE METABOLIC PANEL WITH GFR - Abnormal; Notable for the following components:      Result Value   Glucose, Bld 103 (*)    All other components within normal limits  LIPASE, BLOOD  CBC WITH DIFFERENTIAL/PLATELET    EKG: None  Radiology: No results found.   Procedures   Medications Ordered in the ED  sodium chloride  0.9 % bolus 1,000 mL (1,000 mLs Intravenous New Bag/Given 04/29/24 2210)  pantoprazole  (PROTONIX ) injection 40 mg (40 mg Intravenous Given 04/29/24 2207)  acetaminophen  (TYLENOL ) tablet 1,000 mg (1,000 mg Oral Given 04/29/24 2206)                                    Medical Decision Making Amount and/or Complexity of Data Reviewed Labs: ordered.  Risk OTC drugs. Prescription drug management.   Patient is a 58 year old who presents with vomiting.  Differential diagnosis includes food impaction, esophageal tear, gastritis, gastroenteritis, pancreatitis  Patient presented with some vomiting after he ate some chicken.  It does not sound like he is vomiting stomach contents but rather he is spitting up what ever he swallows.  It sounds like he has had this happen in the past and seems to be consistent with a food impaction.  When I evaluate the patient, he was feeling a little bit better and tried to drink some liquids and at this point seems to be able to keep it down although he still feels like he has some acid reflux.  Labs reviewed and are nonconcerning.  There is no evidence of pancreatitis.  He does not have other symptoms that would be more concerning for ACS.  He was given some IV fluids and some Tylenol  for headache.  He is able to eat and drink without any ongoing vomiting.  He is feeling much better.  Suspect he had an esophageal food impaction that resolved.  He was discharged home in good condition.  Will give him information about following up with gastroenterology.  Return precautions were given.    Final diagnoses:  Food impaction of  esophagus, initial encounter    ED Discharge Orders     None          Lenor Hollering, MD 04/29/24 2312

## 2024-04-29 NOTE — ED Triage Notes (Signed)
 States around 11:30am he ate bojangles and felt like the food got stuck for a few minutes. He feels like the food went down but now he can't eat or drink without vomiting.  He feels like the acid is overproducing in his stomach now.

## 2024-07-11 ENCOUNTER — Emergency Department (HOSPITAL_COMMUNITY)
Admission: EM | Admit: 2024-07-11 | Discharge: 2024-07-11 | Disposition: A | Discharge status: Home / Self Care | Attending: Emergency Medicine | Admitting: Emergency Medicine

## 2024-07-11 ENCOUNTER — Encounter (HOSPITAL_COMMUNITY): Payer: Self-pay

## 2024-07-11 ENCOUNTER — Other Ambulatory Visit: Payer: Self-pay

## 2024-07-11 DIAGNOSIS — R09A2 Foreign body sensation, throat: Secondary | ICD-10-CM | POA: Insufficient documentation

## 2024-07-11 DIAGNOSIS — W44F3XA Food entering into or through a natural orifice, initial encounter: Secondary | ICD-10-CM

## 2024-07-11 MED ORDER — GLUCAGON HCL RDNA (DIAGNOSTIC) 1 MG IJ SOLR
1.0000 mg | Freq: Once | INTRAMUSCULAR | Status: AC
  Administered 2024-07-11: 1 mg via INTRAVENOUS
  Filled 2024-07-11: qty 1

## 2024-07-11 MED ORDER — ONDANSETRON HCL 4 MG/2ML IJ SOLN
4.0000 mg | Freq: Once | INTRAMUSCULAR | Status: AC
  Administered 2024-07-11: 4 mg via INTRAVENOUS

## 2024-07-11 NOTE — ED Triage Notes (Signed)
 Pt had this happen within the last few months in which the chicken cleared out of his throat but tonight he mentions that he was eating chicken around 6:30pm and got the sensation of the chicken getting stuck. If he eats or drinks anything it comes back out but able to swallow his secretions for the most part. No other complaints at this time.

## 2024-07-11 NOTE — Discharge Instructions (Signed)
 You were seen in the ER for your food bolus sensation. This was fortunately relieved by treatment in the ER. Please follow up with the GI listed below and return to the ER with any new severe symptoms.

## 2024-07-11 NOTE — ED Provider Notes (Signed)
 [place] EMERGENCY DEPARTMENT AT [facility] Provider Note   CSN: [id number] Arrival date & time: 07/11/24  9943     Patient presents with: Object stuck in throat   Marc Horton is a 57 y.o. male who presents with concern for food bolus sensation.  States that he had chicken around 6:30 PM, subsequently has felt like he could not tolerate his own secretions, was spitting at all of his saliva up and could not tolerate any further dinner.  Has vomited a few times.  Prompting ED visit.  Had similar presentation in July of this year Which appears to have been spontaneously resolved.  At time of my arrival to the bedside, patient states that he vomited a few minutes ago and now feels he is able to tolerate his own secretions without difficulty, his nausea has completely resolved and he is no longer experiencing the epigastric/lower chest discomfort he was prior.   HPI     Prior to Admission medications   Medication Sig Start Date End Date Taking? Authorizing Provider  Acetaminophen  (TYLENOL  PO) Take by mouth.    [provider]  amoxicillin -clavulanate (AUGMENTIN ) 875-125 MG tablet Take 1 tablet by mouth every 12 (twelve) hours. 03/06/21   Ragan, Michael, FNP  benzonatate  (TESSALON ) 100 MG capsule Take 2 capsules (200 mg total) by mouth 3 (three) times daily as needed for cough. 09/12/20   Arloa Suzen RAMAN, NP  lisinopril  (PRINIVIL ,ZESTRIL ) 10 MG tablet Take 1 tablet (10 mg total) by mouth daily. 01/24/18   Joane Artist RAMAN, MD  predniSONE  (DELTASONE ) 20 MG tablet Take 3 tabs PO daily x 5 days. 03/06/21   Teddy Sharper, FNP  promethazine -codeine  (PHENERGAN  WITH CODEINE ) 6.25-10 MG/5ML syrup Take 5 mLs by mouth every 8 (eight) hours as needed for cough (May cause severe drowsiness). 09/12/20   Arloa Suzen RAMAN, NP    Allergies: Patient has no known allergies.    Review of Systems  Respiratory: Negative.    Cardiovascular: Negative.   Gastrointestinal:        Food  globus sensation    Updated Vital Signs BP (!) 161/74 (BP Location: Right Arm)   Pulse 82   Temp 98 F (36.7 C) (Oral)   Resp 18   SpO2 95%   Physical Exam Vitals and nursing note reviewed.  Constitutional:      Appearance: He is obese. He is not ill-appearing or toxic-appearing.  HENT:     Head: Normocephalic and atraumatic.  Eyes:     General: No scleral icterus.       Right eye: No discharge.        Left eye: No discharge.     Conjunctiva/sclera: Conjunctivae normal.  Pulmonary:     Effort: Pulmonary effort is normal.  Abdominal:     General: Bowel sounds are normal. There is no distension.     Palpations: Abdomen is soft.     Tenderness: There is no abdominal tenderness. There is no right CVA tenderness, left CVA tenderness or guarding.  Skin:    General: Skin is warm and dry.     Capillary Refill: Capillary refill takes less than 2 seconds.  Neurological:     General: No focal deficit present.     Mental Status: He is alert.  Psychiatric:        Mood and Affect: Mood normal.     (all labs ordered are listed, but only abnormal results are displayed) Labs Reviewed - No data to display  EKG:  None  Radiology: No results found.   Procedures   Medications Ordered in the ED - No data to display  Clinical Course as of 07/11/24 0441  [facility] Jul 11, 2024  0340 Patient nausea/V recurred with initial PO challenge. Glucagon administered; nausea resolved. Will trial PO with carbonated beverage.  [RS]    Clinical Course User Index [RS] Turner Baillie, Pleasant SAUNDERS, PA-C                                 Medical Decision Making 58 year old male with globus sensation.  Hypertensive on intake vitals are reassuring, cardiopulmonary and abdominal exams are benign.  Patient tolerating his own secretions at this time, well-appearing.  Risk Prescription drug management.   Will p.o. challenge with carbonated beverage and reevaluate.  Will hold off on glucagon at this  time.  Patient's nausea and food bolus sensation recurred; treated with glucagon and carbonated beverage with resolution of symptoms. Patient tolerated PO without recurrence of his symptoms. Will discharge at this time. Recommend soft diet today, outpatient GI follow up.   Clinical concern for emergent underlying condition that would warrant further ED workup or inpatient management is exceedingly low.  Marc Horton voiced understanding of his medical evaluation and treatment plan. Each of their questions answered to their expressed satisfaction.  Return precautions were given.  Patient is well-appearing, stable, and was discharged in good condition.  This chart was dictated using voice recognition software, Dragon. Despite the best efforts of this provider to proofread and correct errors, errors may still occur which can change documentation meaning.      Final diagnoses:  None    ED Discharge Orders     None          Bobette Pleasant SAUNDERS, PA-C 07/11/24 0442    Theadore Ozell HERO, MD 07/11/24 [phone]

## 2024-08-31 ENCOUNTER — Ambulatory Visit (HOSPITAL_COMMUNITY)
Admission: EM | Admit: 2024-08-31 | Discharge: 2024-08-31 | Disposition: A | Discharge status: Home / Self Care | Attending: Gastroenterology | Admitting: Gastroenterology

## 2024-08-31 ENCOUNTER — Encounter: Payer: Self-pay | Admitting: Emergency Medicine

## 2024-08-31 ENCOUNTER — Encounter (HOSPITAL_COMMUNITY)
Admission: EM | Disposition: A | Discharge status: Home / Self Care | Payer: Self-pay | Source: Home / Self Care | Attending: Emergency Medicine

## 2024-08-31 ENCOUNTER — Ambulatory Visit: Admission: EM | Admit: 2024-08-31 | Discharge: 2024-08-31 | Attending: Family Medicine | Admitting: Family Medicine

## 2024-08-31 ENCOUNTER — Emergency Department (HOSPITAL_COMMUNITY): Admitting: Certified Registered Nurse Anesthetist

## 2024-08-31 ENCOUNTER — Encounter (HOSPITAL_COMMUNITY): Payer: Self-pay | Admitting: *Deleted

## 2024-08-31 ENCOUNTER — Other Ambulatory Visit: Payer: Self-pay

## 2024-08-31 DIAGNOSIS — Z79899 Other long term (current) drug therapy: Secondary | ICD-10-CM | POA: Diagnosis not present

## 2024-08-31 DIAGNOSIS — Z6835 Body mass index (BMI) 35.0-35.9, adult: Secondary | ICD-10-CM | POA: Diagnosis not present

## 2024-08-31 DIAGNOSIS — W44F3XA Food entering into or through a natural orifice, initial encounter: Secondary | ICD-10-CM

## 2024-08-31 DIAGNOSIS — K222 Esophageal obstruction: Secondary | ICD-10-CM | POA: Insufficient documentation

## 2024-08-31 DIAGNOSIS — X58XXXA Exposure to other specified factors, initial encounter: Secondary | ICD-10-CM | POA: Insufficient documentation

## 2024-08-31 DIAGNOSIS — M1990 Unspecified osteoarthritis, unspecified site: Secondary | ICD-10-CM | POA: Insufficient documentation

## 2024-08-31 DIAGNOSIS — Z8616 Personal history of COVID-19: Secondary | ICD-10-CM | POA: Insufficient documentation

## 2024-08-31 DIAGNOSIS — K2289 Other specified disease of esophagus: Secondary | ICD-10-CM | POA: Insufficient documentation

## 2024-08-31 DIAGNOSIS — K219 Gastro-esophageal reflux disease without esophagitis: Secondary | ICD-10-CM | POA: Insufficient documentation

## 2024-08-31 DIAGNOSIS — T18128A Food in esophagus causing other injury, initial encounter: Secondary | ICD-10-CM | POA: Insufficient documentation

## 2024-08-31 DIAGNOSIS — I10 Essential (primary) hypertension: Secondary | ICD-10-CM | POA: Insufficient documentation

## 2024-08-31 DIAGNOSIS — R1319 Other dysphagia: Secondary | ICD-10-CM

## 2024-08-31 DIAGNOSIS — R1310 Dysphagia, unspecified: Secondary | ICD-10-CM | POA: Insufficient documentation

## 2024-08-31 HISTORY — PX: ESOPHAGOGASTRODUODENOSCOPY: SHX5428

## 2024-08-31 LAB — COMPREHENSIVE METABOLIC PANEL WITH GFR
ALT: 29 U/L (ref 0–44)
AST: 28 U/L (ref 15–41)
Albumin: 4.6 g/dL (ref 3.5–5.0)
Alkaline Phosphatase: 82 U/L (ref 38–126)
Anion gap: 13 (ref 5–15)
BUN: 10 mg/dL (ref 6–20)
CO2: 24 mmol/L (ref 22–32)
Calcium: 9.2 mg/dL (ref 8.9–10.3)
Chloride: 103 mmol/L (ref 98–111)
Creatinine, Ser: 0.98 mg/dL (ref 0.61–1.24)
GFR, Estimated: >60 mL/min (ref >60)
Glucose, Bld: 92 mg/dL (ref 70–99)
Potassium: 3.7 mmol/L (ref 3.5–5.1)
Sodium: 140 mmol/L (ref 135–145)
Total Bilirubin: 1.6 mg/dL — ABNORMAL HIGH (ref 0.0–1.2)
Total Protein: 7.4 g/dL (ref 6.5–8.1)

## 2024-08-31 LAB — CBC WITH DIFFERENTIAL/PLATELET
Abs Immature Granulocytes: 0.02 K/uL (ref 0.00–0.07)
Basophils Absolute: 0 K/uL (ref 0.0–0.1)
Basophils Relative: 1 %
Eosinophils Absolute: 0.1 K/uL (ref 0.0–0.5)
Eosinophils Relative: 1 %
HCT: 48.9 % (ref 39.0–52.0)
Hemoglobin: 16.5 g/dL (ref 13.0–17.0)
Immature Granulocytes: 0 %
Lymphocytes Relative: 20 %
Lymphs Abs: 1.6 K/uL (ref 0.7–4.0)
MCH: 28.1 pg (ref 26.0–34.0)
MCHC: 33.7 g/dL (ref 30.0–36.0)
MCV: 83.3 fL (ref 80.0–100.0)
Monocytes Absolute: 0.6 K/uL (ref 0.1–1.0)
Monocytes Relative: 8 %
Neutro Abs: 5.8 K/uL (ref 1.7–7.7)
Neutrophils Relative %: 70 %
Platelets: 272 K/uL (ref 150–400)
RBC: 5.87 MIL/uL — ABNORMAL HIGH (ref 4.22–5.81)
RDW: 13.2 % (ref 11.5–15.5)
WBC: 8.2 K/uL (ref 4.0–10.5)
nRBC: 0 % (ref 0.0–0.2)

## 2024-08-31 SURGERY — EGD (ESOPHAGOGASTRODUODENOSCOPY)
Anesthesia: General

## 2024-08-31 MED ORDER — DEXAMETHASONE SOD PHOSPHATE PF 10 MG/ML IJ SOLN
INTRAMUSCULAR | Status: DC | PRN
  Administered 2024-08-31: 10 mg via INTRAVENOUS

## 2024-08-31 MED ORDER — ROCURONIUM 10MG/ML (10ML) SYRINGE FOR MEDFUSION PUMP - OPTIME
INTRAVENOUS | Status: DC | PRN
Start: 2024-08-31 — End: 2024-08-31
  Administered 2024-08-31: 20 mg via INTRAVENOUS

## 2024-08-31 MED ORDER — SUCRALFATE 1 GM/10ML PO SUSP: 1.0000 g | Freq: Four times a day (QID) | ORAL | 1 refills | Status: AC

## 2024-08-31 MED ORDER — FENTANYL CITRATE (PF) 250 MCG/5ML IJ SOLN
INTRAMUSCULAR | Status: DC | PRN
  Administered 2024-08-31: 100 ug via INTRAVENOUS

## 2024-08-31 MED ORDER — LIDOCAINE HCL (CARDIAC) PF 100 MG/5ML IV SOSY
PREFILLED_SYRINGE | INTRAVENOUS | Status: DC | PRN
  Administered 2024-08-31: 70 mg via INTRAVENOUS

## 2024-08-31 MED ORDER — SUGAMMADEX SODIUM 200 MG/2ML IV SOLN
INTRAVENOUS | Status: DC | PRN
Start: 2024-08-31 — End: 2024-08-31
  Administered 2024-08-31: 200 mg via INTRAVENOUS

## 2024-08-31 MED ORDER — ACETAMINOPHEN 500 MG PO TABS
500.0000 mg | ORAL_TABLET | Freq: Once | ORAL | Status: AC
  Administered 2024-08-31: 500 mg via ORAL

## 2024-08-31 MED ORDER — FENTANYL CITRATE (PF) 100 MCG/2ML IJ SOLN
INTRAMUSCULAR | Status: AC
  Filled 2024-08-31: qty 2

## 2024-08-31 MED ORDER — MIDAZOLAM HCL 2 MG/2ML IJ SOLN
INTRAMUSCULAR | Status: AC
  Filled 2024-08-31: qty 2

## 2024-08-31 MED ORDER — PROPOFOL 10 MG/ML IV BOLUS
INTRAVENOUS | Status: DC | PRN
  Administered 2024-08-31: 200 mg via INTRAVENOUS

## 2024-08-31 MED ORDER — ONDANSETRON HCL 4 MG/2ML IJ SOLN
INTRAMUSCULAR | Status: DC | PRN
  Administered 2024-08-31: 4 mg via INTRAVENOUS

## 2024-08-31 MED ORDER — PHENYLEPHRINE HCL (PRESSORS) 10 MG/ML IV SOLN
INTRAVENOUS | Status: DC | PRN
  Administered 2024-08-31: 160 ug via INTRAVENOUS
  Administered 2024-08-31: 80 ug via INTRAVENOUS

## 2024-08-31 MED ORDER — GLUCAGON HCL RDNA (DIAGNOSTIC) 1 MG IJ SOLR
1.0000 mg | Freq: Once | INTRAMUSCULAR | Status: AC
  Administered 2024-08-31: 1 mg via INTRAVENOUS
  Filled 2024-08-31: qty 1

## 2024-08-31 MED ORDER — SODIUM CHLORIDE 0.9 % IV SOLN: INTRAVENOUS | Status: DC | PRN

## 2024-08-31 MED ORDER — SUCCINYLCHOLINE 20MG/ML (10ML) SYRINGE FOR MEDFUSION PUMP - OPTIME
INTRAMUSCULAR | Status: DC | PRN
  Administered 2024-08-31: 160 mg via INTRAVENOUS

## 2024-08-31 MED ORDER — MIDAZOLAM HCL (PF) 2 MG/2ML IJ SOLN
INTRAMUSCULAR | Status: DC | PRN
  Administered 2024-08-31: 2 mg via INTRAVENOUS

## 2024-08-31 MED ORDER — PANTOPRAZOLE SODIUM 40 MG PO TBEC: 40.0000 mg | DELAYED_RELEASE_TABLET | Freq: Two times a day (BID) | ORAL | 2 refills | Status: DC

## 2024-08-31 MED ORDER — ONDANSETRON 8 MG PO TBDP
8.0000 mg | ORAL_TABLET | Freq: Once | ORAL | Status: AC
  Administered 2024-08-31: 8 mg via ORAL

## 2024-08-31 MED ORDER — ACETAMINOPHEN 500 MG PO TABS
ORAL_TABLET | ORAL | Status: AC
  Filled 2024-08-31: qty 2

## 2024-08-31 NOTE — H&P (View-Only) (Signed)
 Consultation  Referring Provider: ER MD/[facility] Primary Care Physician:  Nena Cyndee DELENA DEVONNA Primary Gastroenterologist: [place]  Reason for Consultation: Esophageal food impaction  HPI: Marc Horton is a 58 y.o. male generally in good health, not on any chronic medications.  Patient states that he had gone out for dinner last night for his anniversary, and ate primary.  He says after the first good bite of food and some bread he felt as if the food was lodged in his esophagus.  This persisted for multiple hours.  He says he tried to retch but was unable to get the food to come back up.  This morning he still has sensation of something being stuck in his esophagus and he has been unable to keep down any water without bringing it back up.  He is also intermittently spitting up his saliva. Glucagon  has been given in the ER without relief of symptoms. Patient also had ER visit in July and October 2025 with similar episodes and says that the glucagon  helped relieve both of these episodes.  He has not seen a GI MD.  He does not have any regular heartburn or indigestion on a daily basis.  He says he has intermittently had sensation of milder episodes of dysphagia or transient food impaction but this is the most persistent episode that he has had.  No regular prescription medications, no blood thinners  Past Medical History:  Diagnosis Date   COVID    Hypercholesteremia    Pneumonia     Past Surgical History:  Procedure Laterality Date   APPENDECTOMY     KNEE ARTHROSCOPY Right     Prior to Admission medications   Medication Sig Start Date End Date Taking? Authorizing Provider  HYDROcodone -acetaminophen  (NORCO/VICODIN) 5-325 MG tablet Take 1 tablet by mouth every 4 (four) hours as needed. 06/27/24   [provider]    No current facility-administered medications for this encounter.   Current Outpatient Medications  Medication Sig Dispense Refill    HYDROcodone -acetaminophen  (NORCO/VICODIN) 5-325 MG tablet Take 1 tablet by mouth every 4 (four) hours as needed.      Allergies as of 08/31/2024   (No Known Allergies)    Family History  Problem Relation Age of Onset   Diabetes Mother    Heart disease Mother    Heart failure Mother    Heart disease Father    Heart failure Father     Social History   Socioeconomic History   Marital status: Single    Spouse name: Not on file   Number of children: Not on file   Years of education: Not on file   Highest education level: Not on file  Occupational History   Not on file  Tobacco Use   Smoking status: Never   Smokeless tobacco: Never  Vaping Use   Vaping status: Never Used  Substance and Sexual Activity   Alcohol use: No   Drug use: No   Sexual activity: Not Currently  Other Topics Concern   Not on file  Social History Narrative   ** Merged History Encounter **       Social Drivers of Health   Financial Resource Strain: Not on file  Food Insecurity: Not on file  Transportation Needs: Not on file  Physical Activity: Not on file  Stress: Not on file  Social Connections: Not on file  Intimate Partner Violence: Not on file    Review of Systems: Pertinent positive and negative review of systems  were noted in the above HPI section.  All other review of systems was otherwise negative.   Physical Exam: Vital signs in last 24 hours: Temp:  [98 F (36.7 C)-98.2 F (36.8 C)] 98 F (36.7 C) (11/23 1134) Pulse Rate:  [79-82] 82 (11/23 1134) Resp:  [16-18] 16 (11/23 1134) BP: (159-190)/(80-102) 159/80 (11/23 1134) SpO2:  [95 %-99 %] 99 % (11/23 1134) Weight:  [117.9 kg] 117.9 kg (11/23 1201)   General:   Alert,  Well-developed, well-nourished, older white male pleasant and cooperative in NAD, wife at bedside patient spitting into a cup Head:  Normocephalic and atraumatic. Eyes:  Sclera clear, no icterus.   Conjunctiva pink. Ears:  Normal auditory acuity. Nose:  No  deformity, discharge,  or lesions. Mouth:  No deformity or lesions.   Neck:  Supple; no masses or thyromegaly. Lungs:  Clear throughout to auscultation.   No wheezes, crackles, or rhonchi.  Heart:  Regular rate and rhythm; no murmurs, clicks, rubs,  or gallops. Abdomen:  Soft,nontender, BS active,nonpalp mass or hsm.   Rectal: Not done Msk:  Symmetrical without gross deformities. . Pulses:  Normal pulses noted. Extremities:  Without clubbing or edema. Neurologic:  Alert and  oriented x4;  grossly normal neurologically. Skin:  Intact without significant lesions or rashes.. Psych:  Alert and cooperative. Normal mood and affect.  Intake/Output from previous day: No intake/output data recorded. Intake/Output this shift: Total I/O In: 1000 [I.V.:1000] Out: -   Lab Results: Recent Labs    08/31/24 1231  WBC 8.2  HGB 16.5  HCT 48.9  PLT 272   BMET Recent Labs    08/31/24 1231  NA 140  K 3.7  CL 103  CO2 24  GLUCOSE 92  BUN 10  CREATININE 0.98  CALCIUM 9.2   LFT Recent Labs    08/31/24 1231  PROT 7.4  ALBUMIN 4.6  AST 28  ALT 29  ALKPHOS 82  BILITOT 1.6*   PT/INR No results for input(s): LABPROT, INR in the last 72 hours. Hepatitis Panel No results for input(s): HEPBSAG, HCVAB, HEPAIGM, HEPBIGM in the last 72 hours.   IMPRESSION:  #[age] year old white male who presents with complaint of steak being stuck in his esophagus since last night.  He has been unable to take any liquids since that time and has been spitting up his saliva.  Any liquids that he tries would eventually come back up.  He was given glucagon  in the ER without relief of symptoms.  Patient had a similar episode in July 2025 and again in October both episodes were relieved with glucagon . No chronic GERD symptoms though he has had intermittent milder episodes of dysphagia or transient sensation of food sticking over the past year.  Symptoms currently consistent with esophageal food  impaction, suspect underlying stricture or ring  Plan; keep n.p.o. Patient will be scheduled for EGD with removal of food impaction with Dr. San later this afternoon.  Procedure was discussed in detail with the patient including indications risks and benefits and he is agreeable to proceed.  I explained to him that he would not have esophageal dilation today if he does have evidence of esophageal stricture or esophageal ring.  We will likely start him on a PPI and then get him scheduled for outpatient EGD with dilation as indicated per Dr. San.   Patient's wife (who is deaf) is accompanying him today and available to drive home      [facility]  08/31/2024, 2:13 PM  Attending physician's note   I have taken a history, reviewed the chart, and examined the patient. I performed a substantive portion of this encounter, including complete performance of at least one of the key components, in conjunction with the APP. I agree with the APP's note, impression, and recommendations with my edits.   58 year old male presenting with acute esophageal food impaction after eating steak last evening.  States that the food is stuck and not tolerating secretions.  Has had similar symptoms in the past, including ER evaluation in July and October.  Symptoms relieved with glucagon , but he never followed up with GI as recommended.  No prior EGD.  1) Acute esophageal food impaction 2) Dysphagia - Plan for emergent EGD today for food retrieval - Additional recommendations pending endoscopic findings - Anesthesia consult for procedure  The indications, risks, and benefits of EGD were explained to the patient in detail. Risks include but are not limited to bleeding, perforation, adverse reaction to medications, and cardiopulmonary compromise. Sequelae include but are not limited to the possibility of surgery, hospitalization, and mortality. The patient verbalized understanding and wished to  proceed.    [address] DO, FACG [phone] office

## 2024-08-31 NOTE — Transfer of Care (Signed)
 Immediate Anesthesia Transfer of Care Note  Patient: Marc Horton  Procedure(s) Performed: EGD (ESOPHAGOGASTRODUODENOSCOPY)  Patient Location: PACU  Anesthesia Type:General  Level of Consciousness: awake and alert   Airway & Oxygen Therapy: Patient Spontanous Breathing  Post-op Assessment: Report given to RN and Post -op Vital signs reviewed and stable  Post vital signs: Reviewed and stable  Last Vitals:  Vitals Value Taken Time  BP 144/80   Temp    Pulse 88 08/31/24 17:47  Resp 22 08/31/24 17:47  SpO2 96 % 08/31/24 17:47  Vitals shown include unfiled device data.  Last Pain:  Vitals:   08/31/24 1526  TempSrc: Temporal  PainSc: 0-No pain         Complications: No notable events documented.

## 2024-08-31 NOTE — ED Triage Notes (Addendum)
 Patient presents to Urgent Care with complaints of nausea and increased reflux symptoms since last night. Patient reports he was eating a Steak last night. Feels like increased acid reflux, nausea, vomited. Having to spit up the acid. Has had bowel movements yesterday twice. Haven't had much to drink. Has had similar issues in the past and went to ER for treatment. Feeling gas and bloating in abdomen area.

## 2024-08-31 NOTE — Discharge Instructions (Signed)

## 2024-08-31 NOTE — ED Triage Notes (Addendum)
 BIB spouse from home for esophageal blockage since last night after eating steak. H/o same, 3rd time. Denies GI procedures or endoscopy. Reports unable to keep anything down, and spitting, vomiting, reflux and HA. Relates HA to not eating or drinking. Denies fever, sob, cough, syncope, or bleeding. Alert, NAD, calm, interactive, resps e/u, speaking in clear complete sentences.  Spitting into cup. Steady gait.

## 2024-08-31 NOTE — ED Notes (Signed)
 Patient is being discharged from the Urgent Care and sent to the Emergency Department via private vehicle . Per Dr. Maranda, patient is in need of higher level of care due to concern for food obstruction in esophagus. Patient is aware and verbalizes understanding of plan of care.   RN spoke to Dr. Mannie at [facility] and they do not have GI. Patient advised to go to [facility] or [facility] and he states he will go to [facility]  Vitals:   08/31/24 0947  BP: (!) 190/102  Pulse: 79  Resp: 18  Temp: 98.2 F (36.8 C)  SpO2: 95%

## 2024-08-31 NOTE — Interval H&P Note (Signed)
 History and Physical Interval Note:  08/31/2024 4:27 PM  Marc Horton  has presented today for surgery, with the diagnosis of esophageal food impaction.  The various methods of treatment have been discussed with the patient and family. After consideration of risks, benefits and other options for treatment, the patient has consented to  Procedure(s): EGD (ESOPHAGOGASTRODUODENOSCOPY) (N/A) as a surgical intervention.  The patient's history has been reviewed, patient examined, no change in status, stable for surgery.  I have reviewed the patient's chart and labs.  Questions were answered to the patient's satisfaction.     Marc Horton

## 2024-08-31 NOTE — ED Provider Notes (Signed)
 [facility]MENT AT [facility] Provider Note   CSN: [id number] Arrival date & time: 08/31/24  1125     Patient presents with: esopophageal food bolus (like)   Marc Horton is a 58 y.o. male.   58 year old male with prior medical history as detailed below presents for evaluation.  Last night around 5 PM he had primary rib steak for dinner.  Ever since he feels like the steak is caught up in his esophagus.  He has been unable to tolerate food or liquids since.  He is having to spit up his own saliva.  He reports that this has happened twice before.  Symptoms resolved after some time and without GI intervention.  He denies prior history of EGD or GI evaluation.  Patient was given glucagon  in triage.  This did not help with symptoms.  He continues to spit his own secretions into a cup.  The history is provided by the patient and medical records.       Prior to Admission medications   Not on File    Allergies: Patient has no known allergies.    Review of Systems  All other systems reviewed and are negative.   Updated Vital Signs BP (!) 159/80   Pulse 82   Temp 98 F (36.7 C)   Resp 16   Ht 6' (1.829 m)   Wt 117.9 kg   SpO2 99%   BMI 35.26 kg/m   Physical Exam Vitals and nursing note reviewed.  Constitutional:      General: He is not in acute distress.    Appearance: Normal appearance. He is well-developed.  HENT:     Head: Normocephalic and atraumatic.  Eyes:     Conjunctiva/sclera: Conjunctivae normal.     Pupils: Pupils are equal, round, and reactive to light.  Cardiovascular:     Rate and Rhythm: Normal rate and regular rhythm.     Heart sounds: Normal heart sounds.  Pulmonary:     Effort: Pulmonary effort is normal. No respiratory distress.     Breath sounds: Normal breath sounds.  Abdominal:     General: There is no distension.     Palpations: Abdomen is soft.     Tenderness: There is no abdominal tenderness.   Musculoskeletal:        General: No deformity. Normal range of motion.     Cervical back: Normal range of motion and neck supple.  Skin:    General: Skin is warm and dry.  Neurological:     General: No focal deficit present.     Mental Status: He is alert and oriented to person, place, and time.     (all labs ordered are listed, but only abnormal results are displayed) Labs Reviewed  CBC WITH DIFFERENTIAL/PLATELET - Abnormal; Notable for the following components:      Result Value   RBC 5.87 (*)    All other components within normal limits  COMPREHENSIVE METABOLIC PANEL WITH GFR - Abnormal; Notable for the following components:   Total Bilirubin 1.6 (*)    All other components within normal limits    EKG: None  Radiology: No results found.   Procedures   Medications Ordered in the ED  glucagon  (human recombinant) (GLUCAGEN ) injection 1 mg (1 mg Intravenous Given 08/31/24 1227)  Medical Decision Making Patient with story consistent with likely esophageal foreign body (patient reports eating steak for dinner last night).  No improvement with symptoms after glucagon .  [facility] is aware of case and will evaluate for EGD.  Risk Decision regarding hospitalization.        Final diagnoses:  Esophageal obstruction due to food impaction    ED Discharge Orders     None          Marc Maude BROCKS, MD 08/31/24 1600

## 2024-08-31 NOTE — ED Provider Notes (Signed)
 [facility]    CSN: [id number] Arrival date & time: 08/31/24  9071      History   Chief Complaint Chief Complaint  Patient presents with   Nausea   Heartburn   Emesis    HPI Marc Horton is a 58 y.o. male.   Patient has had 2 emergency room visits for a sensation of food bolus.  1 in September and then 1 in October.  He states he went out to dinner last night and had steak.  He states that he had to leave the restaurant before finishing because he had a sensation something was stuck in his lower esophagus, upper chest region.  He states that he is been unable to swallow since that time.  He is here spitting in a cup.  States he is having nausea.  Feels like he will throw up.  States he does not usually have acid reflux problems.  He is not on any GERD medicine.  He has not followed up with gastro enterology as recommended.  It is emphasized to him today that this is necessary Denies NSAID use, alcohol, tobacco    Past Medical History:  Diagnosis Date   COVID    Hypercholesteremia    Pneumonia     Patient Active Problem List   Diagnosis Date Noted   Arthritis of knee, degenerative 11/22/2016   HTN (hypertension) 11/11/2015   Morbid obesity (HCC) 11/11/2015   Tachycardia 11/11/2015   Low libido 11/11/2015    Past Surgical History:  Procedure Laterality Date   APPENDECTOMY     KNEE ARTHROSCOPY Right        Home Medications    Prior to Admission medications   Not on File    Family History Family History  Problem Relation Age of Onset   Diabetes Mother    Heart disease Mother    Heart failure Mother    Heart disease Father    Heart failure Father     Social History Social History   Tobacco Use   Smoking status: Never   Smokeless tobacco: Never  Vaping Use   Vaping status: Never Used  Substance Use Topics   Alcohol use: No   Drug use: No     Allergies   Patient has no known allergies.   Review of Systems Review of  Systems See HPI  Physical Exam Triage Vital Signs ED Triage Vitals [08/31/24 0947]  Encounter Vitals Group     BP (!) 190/102     Girls Systolic BP Percentile      Girls Diastolic BP Percentile      Boys Systolic BP Percentile      Boys Diastolic BP Percentile      Pulse Rate 79     Resp 18     Temp 98.2 F (36.8 C)     Temp Source Oral     SpO2 95 %     Weight      Height      Head Circumference      Peak Flow      Pain Score      Pain Loc      Pain Education      Exclude from Growth Chart    No data found.  Updated Vital Signs BP (!) 190/102 (BP Location: Right Arm)   Pulse 79   Temp 98.2 F (36.8 C) (Oral)   Resp 18   SpO2 95%       Physical Exam Constitutional:  General: He is in acute distress.     Appearance: He is obese.     Comments: Patient sitting forward on the tip of the exam table, spitting into a cup  Cardiovascular:     Rate and Rhythm: Normal rate and regular rhythm.     Heart sounds: Normal heart sounds.  Pulmonary:     Breath sounds: Normal breath sounds.  Abdominal:     Comments: Protuberant abdomen.  No specific tenderness  Neurological:     Mental Status: He is alert.      UC Treatments / Results  Labs (all labs ordered are listed, but only abnormal results are displayed) Labs Reviewed - No data to display  EKG   Radiology No results found.  Procedures Procedures (including critical care time)  Medications Ordered in UC Medications  ondansetron  (ZOFRAN -ODT) disintegrating tablet 8 mg (8 mg Oral Given 08/31/24 1013)    Initial Impression / Assessment and Plan / UC Course  I have reviewed the triage vital signs and the nursing notes.  Pertinent labs & imaging results that were available during my care of the patient were reviewed by me and considered in my medical decision making (see chart for details).     Patient states in the emergency room they gave him nausea medicine which helped relieve his symptoms.  I  ordered 8 mg of Zofran  which patient took, but states that it is not helping.  I explained to him that he had a problem that was out of the possibilities for treatment at an urgent care center and that he needed to go to an emergency room.  He requested to go to [facility], but a phone to [facility] revealed that he did not have GI capabilities and he would have to be transferred to [facility] or [facility].  He was therefore sent to the ER at [facility].  Report called Final Clinical Impressions(s) / UC Diagnoses   Final diagnoses:  Esophageal obstruction due to food impaction     Discharge Instructions      Go directly to the ER     ED Prescriptions   None    PDMP not reviewed this encounter.   Maranda Jamee Jacob, MD 08/31/24 [phone]

## 2024-08-31 NOTE — Op Note (Signed)
 [facility] Patient Name: Marc Horton Procedure Date : 08/31/2024 MRN: [id number] Attending MD: Sandor Flatter , MD, [id number] Date of Birth: [birth year] CSN: [id number] Age: 58 Admit Type: Emergency Department Procedure:                Upper GI endoscopy Indications:              Dysphagia, Foreign body in the esophagus Providers:                Sandor Flatter, MD, Collene Edu, RN, Lorrayne Kitty,                            Technician Referring MD:              Medicines:                Monitored Anesthesia Care with elective intubation Complications:            No immediate complications. Estimated Blood Loss:     Estimated blood loss was minimal. Procedure:                Pre-Anesthesia Assessment:                           - Prior to the procedure, a History and Physical                            was performed, and patient medications and                            allergies were reviewed. The patient's tolerance of                            previous anesthesia was also reviewed. The risks                            and benefits of the procedure and the sedation                            options and risks were discussed with the patient.                            All questions were answered, and informed consent                            was obtained. Prior Anticoagulants: The patient has                            taken no anticoagulant or antiplatelet agents. ASA                            Grade Assessment: II - A patient with mild systemic                            disease. After reviewing the risks and benefits,  the patient was deemed in satisfactory condition to                            undergo the procedure.                           After obtaining informed consent, the endoscope was                            passed under direct vision. Throughout the                            procedure, the patient's blood pressure,  pulse, and                            oxygen saturations were monitored continuously. The                            [id number]-H190 ([id number]) Olympus endoscope was introduced                            through the mouth, and advanced to the second part                            of duodenum. The upper GI endoscopy was                            accomplished without difficulty. The patient                            tolerated the procedure well. Scope In: Scope Out: Findings:      A large food bolus was found in the lower third of the esophagus.       Removal was accomplished with a combination of rat-toothed forceps,       Raptor grasping device and Roth net. Estimated blood loss was minimal.       After food bolus removal, the area was irrigated and inspected. The       mucosa in the distal esophagus was friable and erythematous consistent       with stasis injury.      Two benign-appearing, intrinsic severe stenoses were found 38 cm from       the incisors. The stenoses were traversed. These were intentionally not       dilated today given friable esophageal mucosa.      The entire examined stomach was normal.      The examined duodenum was normal. Impression:               - Food in the lower third of the esophagus. Removal                            was successful.                           - Benign-appearing esophageal stenoses.                           -  Normal stomach.                           - Normal examined duodenum.                           - At the conclusion of the procedure, biopsies were                            taken from the distal and proximal esophagus to                            evaluate for eosinophilic esophagitis. The distal                            esophageal biopsies were taken at a location                            proximal to the friable mucosa and proximal to the                            strictures. Recommendation:           - Transported to PACU  with plan for discharge home                            following recovery.                           - Patient has a contact number available for                            emergencies. The signs and symptoms of potential                            delayed complications were discussed with the                            patient. Return to normal activities tomorrow.                            Written discharge instructions were provided to the                            patient.                           - Full liquid diet for 2 days, then advance to                            mechanical soft diet. Do not advance beyond                            mechanical soft diet until follow-up in the GI  clinic.                           - Use Protonix  (pantoprazole ) 40 mg PO BID for 8                            weeks to promote mucosal healing, then reduce to 40                            mg daily. Do not stop taking pantoprazole  until                            seen in the GI clinic and repeat upper endoscopy..                           - Use sucralfate  suspension 1 gram PO QID for 2                            weeks.                           - Await pathology results.                           - Return to GI clinic at appointment to be                            scheduled.                           - Repeat upper endoscopy in 6 weeks to check                            healing. Will plan for esophageal stricture                            dilation at that time.                           - Our office will reach out to the patient in the                            morning to start scheduling follow-up with me. Procedure Code(s):        --- Professional ---                           [phone], Esophagogastroduodenoscopy, flexible,                            transoral; with removal of foreign body(s)                           43239, Esophagogastroduodenoscopy, flexible,  transoral; with biopsy, single or multiple Diagnosis Code(s):        --- Professional ---                           U81.871J, Food in esophagus causing other injury,                            initial encounter                           K22.2, Esophageal obstruction                           R13.10, Dysphagia, unspecified                           T18.108A, Unspecified foreign body in esophagus                            causing other injury, initial encounter CPT copyright 2022 [employer]. All rights reserved. The codes documented in this report are preliminary and upon coder review may  be revised to meet current compliance requirements. Sandor Flatter, MD 08/31/2024 5:47:43 PM Number of Addenda: 0

## 2024-08-31 NOTE — ED Triage Notes (Signed)
 Patient reports that he went to eat at 5pm last night and he believes there is food stuck in his stomach. He states he has been throwing up and nauseated since last night with some heartburn. Patient is vomiting at registration desk.

## 2024-08-31 NOTE — ED Notes (Signed)
 No obvious or significant change or improvement, still sipping and spitting.

## 2024-08-31 NOTE — ED Provider Triage Note (Signed)
 Emergency Medicine Provider Triage Evaluation Note  Marc Horton , a 58 y.o. male  was evaluated in triage.  Pt complains of dysphagia. Ate a bite of prime steak last night and felt pressure to the stomach area, since then he's unable to tolerates any food or liquid, having to spit up his saliva.  No sob, no cp, or abd pain.  Went to [facility] and was given zofran  which hasn't help.  Have somewhat similar sxs twice in the past, which resolved.  Haven't seen by GI yet  Review of Systems  Positive: As above Negative: As above  Physical Exam  BP (!) 159/80   Pulse 82   Temp 98 F (36.7 C)   Resp 16   Ht 6' (1.829 m)   Wt 117.9 kg   SpO2 99%   BMI 35.26 kg/m  Gen:   Awake, no distress   Resp:  Normal effort  MSK:   Moves extremities without difficulty  Other:  Holding a cup to spit  Medical Decision Making  Medically screening exam initiated at 12:14 PM.  Appropriate orders placed.  Marc Horton was informed that the remainder of the evaluation will be completed by another provider, this initial triage assessment does not replace that evaluation, and the importance of remaining in the ED until their evaluation is complete.  Glucagon  given, may neend GI and scope   Nivia Colon, PA-C 08/31/24 1215

## 2024-08-31 NOTE — Anesthesia Procedure Notes (Signed)
 Procedure Name: Intubation Date/Time: 08/31/2024 4:55 PM  Performed by: Vaughn Zebedee HERO, CRNAPre-anesthesia Checklist: Patient identified, Emergency Drugs available, Suction available and Patient being monitored Patient Re-evaluated:Patient Re-evaluated prior to induction Oxygen Delivery Method: Circle system utilized Preoxygenation: Pre-oxygenation with 100% oxygen Induction Type: IV induction, Rapid sequence and Cricoid Pressure applied Laryngoscope Size: Miller and 2 Grade View: Grade I Tube type: Oral Tube size: 7.5 mm Number of attempts: 1 Airway Equipment and Method: Stylet Placement Confirmation: ETT inserted through vocal cords under direct vision, positive ETCO2 and breath sounds checked- equal and bilateral Secured at: 23 cm Tube secured with: Tape Dental Injury: Teeth and Oropharynx as per pre-operative assessment

## 2024-08-31 NOTE — Consult Note (Addendum)
 Consultation  Referring Provider: ER MD/MC/[facility] Primary Care Physician:  Nena Cyndee DELENA DEVONNA Primary Gastroenterologist: Sampson  Reason for Consultation: Esophageal food impaction  HPI: Marc Horton is a 58 y.o. male generally in good health, not on any chronic medications.  Patient states that he had gone out for dinner last night for his anniversary, and ate primary.  He says after the first good bite of food and some bread he felt as if the food was lodged in his esophagus.  This persisted for multiple hours.  He says he tried to retch but was unable to get the food to come back up.  This morning he still has sensation of something being stuck in his esophagus and he has been unable to keep down any water without bringing it back up.  He is also intermittently spitting up his saliva. Glucagon  has been given in the ER without relief of symptoms. Patient also had ER visit in July and October 2025 with similar episodes and says that the glucagon  helped relieve both of these episodes.  He has not seen a GI MD.  He does not have any regular heartburn or indigestion on a daily basis.  He says he has intermittently had sensation of milder episodes of dysphagia or transient food impaction but this is the most persistent episode that he has had.  No regular prescription medications, no blood thinners  Past Medical History:  Diagnosis Date   COVID    Hypercholesteremia    Pneumonia     Past Surgical History:  Procedure Laterality Date   APPENDECTOMY     KNEE ARTHROSCOPY Right     Prior to Admission medications   Medication Sig Start Date End Date Taking? Authorizing Provider  HYDROcodone -acetaminophen  (NORCO/VICODIN) 5-325 MG tablet Take 1 tablet by mouth every 4 (four) hours as needed. 06/27/24   [provider]    No current facility-administered medications for this encounter.   Current Outpatient Medications  Medication Sig Dispense Refill    HYDROcodone -acetaminophen  (NORCO/VICODIN) 5-325 MG tablet Take 1 tablet by mouth every 4 (four) hours as needed.      Allergies as of 08/31/2024   (No Known Allergies)    Family History  Problem Relation Age of Onset   Diabetes Mother    Heart disease Mother    Heart failure Mother    Heart disease Father    Heart failure Father     Social History   Socioeconomic History   Marital status: Single    Spouse name: Not on file   Number of children: Not on file   Years of education: Not on file   Highest education level: Not on file  Occupational History   Not on file  Tobacco Use   Smoking status: Never   Smokeless tobacco: Never  Vaping Use   Vaping status: Never Used  Substance and Sexual Activity   Alcohol use: No   Drug use: No   Sexual activity: Not Currently  Other Topics Concern   Not on file  Social History Narrative   ** Merged History Encounter **       Social Drivers of Health   Financial Resource Strain: Not on file  Food Insecurity: Not on file  Transportation Needs: Not on file  Physical Activity: Not on file  Stress: Not on file  Social Connections: Not on file  Intimate Partner Violence: Not on file    Review of Systems: Pertinent positive and negative review of systems  were noted in the above HPI section.  All other review of systems was otherwise negative.   Physical Exam: Vital signs in last 24 hours: Temp:  [98 F (36.7 C)-98.2 F (36.8 C)] 98 F (36.7 C) (11/23 1134) Pulse Rate:  [79-82] 82 (11/23 1134) Resp:  [16-18] 16 (11/23 1134) BP: (159-190)/(80-102) 159/80 (11/23 1134) SpO2:  [95 %-99 %] 99 % (11/23 1134) Weight:  [117.9 kg] 117.9 kg (11/23 1201)   General:   Alert,  Well-developed, well-nourished, older white male pleasant and cooperative in NAD, wife at bedside patient spitting into a cup Head:  Normocephalic and atraumatic. Eyes:  Sclera clear, no icterus.   Conjunctiva pink. Ears:  Normal auditory acuity. Nose:  No  deformity, discharge,  or lesions. Mouth:  No deformity or lesions.   Neck:  Supple; no masses or thyromegaly. Lungs:  Clear throughout to auscultation.   No wheezes, crackles, or rhonchi.  Heart:  Regular rate and rhythm; no murmurs, clicks, rubs,  or gallops. Abdomen:  Soft,nontender, BS active,nonpalp mass or hsm.   Rectal: Not done Msk:  Symmetrical without gross deformities. . Pulses:  Normal pulses noted. Extremities:  Without clubbing or edema. Neurologic:  Alert and  oriented x4;  grossly normal neurologically. Skin:  Intact without significant lesions or rashes.. Psych:  Alert and cooperative. Normal mood and affect.  Intake/Output from previous day: No intake/output data recorded. Intake/Output this shift: Total I/O In: 1000 [I.V.:1000] Out: -   Lab Results: Recent Labs    08/31/24 1231  WBC 8.2  HGB 16.5  HCT 48.9  PLT 272   BMET Recent Labs    08/31/24 1231  NA 140  K 3.7  CL 103  CO2 24  GLUCOSE 92  BUN 10  CREATININE 0.98  CALCIUM 9.2   LFT Recent Labs    08/31/24 1231  PROT 7.4  ALBUMIN 4.6  AST 28  ALT 29  ALKPHOS 82  BILITOT 1.6*   PT/INR No results for input(s): LABPROT, INR in the last 72 hours. Hepatitis Panel No results for input(s): HEPBSAG, HCVAB, HEPAIGM, HEPBIGM in the last 72 hours.   IMPRESSION:  #[age] year old white male who presents with complaint of steak being stuck in his esophagus since last night.  He has been unable to take any liquids since that time and has been spitting up his saliva.  Any liquids that he tries would eventually come back up.  He was given glucagon  in the ER without relief of symptoms.  Patient had a similar episode in July 2025 and again in October both episodes were relieved with glucagon . No chronic GERD symptoms though he has had intermittent milder episodes of dysphagia or transient sensation of food sticking over the past year.  Symptoms currently consistent with esophageal food  impaction, suspect underlying stricture or ring  Plan; keep n.p.o. Patient will be scheduled for EGD with removal of food impaction with Dr. San later this afternoon.  Procedure was discussed in detail with the patient including indications risks and benefits and he is agreeable to proceed.  I explained to him that he would not have esophageal dilation today if he does have evidence of esophageal stricture or esophageal ring.  We will likely start him on a PPI and then get him scheduled for outpatient EGD with dilation as indicated per Dr. San.   Patient's wife (who is deaf) is accompanying him today and available to drive home      [facility]  08/31/2024, 2:13 PM  Attending physician's note   I have taken a history, reviewed the chart, and examined the patient. I performed a substantive portion of this encounter, including complete performance of at least one of the key components, in conjunction with the APP. I agree with the APP's note, impression, and recommendations with my edits.   58 year old male presenting with acute esophageal food impaction after eating steak last evening.  States that the food is stuck and not tolerating secretions.  Has had similar symptoms in the past, including ER evaluation in July and October.  Symptoms relieved with glucagon , but he never followed up with GI as recommended.  No prior EGD.  1) Acute esophageal food impaction 2) Dysphagia - Plan for emergent EGD today for food retrieval - Additional recommendations pending endoscopic findings - Anesthesia consult for procedure  The indications, risks, and benefits of EGD were explained to the patient in detail. Risks include but are not limited to bleeding, perforation, adverse reaction to medications, and cardiopulmonary compromise. Sequelae include but are not limited to the possibility of surgery, hospitalization, and mortality. The patient verbalized understanding and wished to  proceed.    [address] DO, FACG [phone] office

## 2024-08-31 NOTE — ED Notes (Signed)
 Given PO challenge, gingerale. Alert, NAD, calm, interactive.

## 2024-08-31 NOTE — Discharge Instructions (Signed)
 Go directly to the ER.

## 2024-09-01 ENCOUNTER — Other Ambulatory Visit (HOSPITAL_COMMUNITY): Payer: Self-pay

## 2024-09-01 ENCOUNTER — Telehealth: Payer: Self-pay

## 2024-09-01 DIAGNOSIS — R1319 Other dysphagia: Secondary | ICD-10-CM

## 2024-09-01 DIAGNOSIS — K222 Esophageal obstruction: Secondary | ICD-10-CM

## 2024-09-01 DIAGNOSIS — W44F3XA Food entering into or through a natural orifice, initial encounter: Secondary | ICD-10-CM

## 2024-09-01 NOTE — Telephone Encounter (Signed)
 Left message for patient to return call to further discuss Dr Rennis recommendations.  Will continue efforts.

## 2024-09-01 NOTE — Telephone Encounter (Signed)
-----   Message from Sandor LULLA Flatter sent at 08/31/2024  5:47 PM EST ----- Patient presented with acute esophageal food impaction.  EGD completed.  Will need the following: - Follow-up appointment in the GI clinic in about 4 weeks.  Can be with me or one of the pod B APP's - EGD with me in the [facility] in 6-8 weeks - Please call to check in on him tomorrow morning and see how he is doing -To stay on full liquids for 48 hours then soft foods until he is seen in the GI clinic  Thanks.

## 2024-09-01 NOTE — Telephone Encounter (Signed)
 Pharmacy Patient Advocate Encounter   Received notification from Patient Pharmacy that prior authorization for Pantoprazole  Sodium 40MG  dr tablets is required/requested.   Insurance verification completed.   The patient is insured through [employer].   Per test claim: PA required; PA submitted to above mentioned insurance via Latent Key/confirmation #/EOC [id number] Status is pending

## 2024-09-01 NOTE — Telephone Encounter (Signed)
 Patient advised that he has been scheduled to see Deanna May, NP on 09-29-24 3:20pm.  Patient aware that he was scheduled for EGD in the [facility] on 10-15-24 at 9am with Dr San.  Patient can not access his MyChart, so he was advised that a printed copy of instructions would be given to him at his appointment with Deanna on 09-29-24.  I explained to the patient that he should remain on a full liquid diet for 48 hours and then soft foods until he is seen by Deanna in December.  Patient agreed to plan and verbalized understanding.  No further questions or concerns.

## 2024-09-02 ENCOUNTER — Encounter (HOSPITAL_COMMUNITY): Payer: Self-pay | Admitting: Gastroenterology

## 2024-09-02 ENCOUNTER — Other Ambulatory Visit (HOSPITAL_COMMUNITY): Payer: Self-pay

## 2024-09-02 NOTE — Telephone Encounter (Signed)
 Pharmacy Patient Advocate Encounter  Received notification from [facility] that Prior Authorization for Pantoprazole  Sodium 40MG  dr tablets has been APPROVED from 09-01-2024 to 11-01-2024   PA #/Case ID/Reference #: [id number]

## 2024-09-02 NOTE — Anesthesia Preprocedure Evaluation (Signed)
 Anesthesia Evaluation  Patient identified by MRN, date of birth, ID band Patient awake    Reviewed: Allergy & Precautions, NPO status , Patient's Chart, lab work & pertinent test results  History of Anesthesia Complications Negative for: history of anesthetic complications  Airway Mallampati: III  TM Distance: >3 FB Neck ROM: Full    Dental  (+) Dental Advisory Given   Pulmonary neg shortness of breath, neg sleep apnea, neg COPD, neg recent URI   breath sounds clear to auscultation       Cardiovascular hypertension,  Rhythm:Regular     Neuro/Psych negative neurological ROS     GI/Hepatic Neg liver ROS,,,  Endo/Other  negative endocrine ROS    Renal/GU negative Renal ROS     Musculoskeletal  (+) Arthritis ,    Abdominal   Peds  Hematology negative hematology ROS (+)   Anesthesia Other Findings  esophageal food impaction  Reproductive/Obstetrics                              Anesthesia Physical Anesthesia Plan  ASA: 2  Anesthesia Plan: General   Post-op Pain Management: Minimal or no pain anticipated   Induction: Intravenous, Rapid sequence and Cricoid pressure planned  PONV Risk Score and Plan: 2 and Ondansetron  and Dexamethasone   Airway Management Planned: Oral ETT  Additional Equipment: None  Intra-op Plan:   Post-operative Plan: Extubation in OR  Informed Consent: I have reviewed the patients History and Physical, chart, labs and discussed the procedure including the risks, benefits and alternatives for the proposed anesthesia with the patient or authorized representative who has indicated his/her understanding and acceptance.     Dental advisory given  Plan Discussed with: CRNA  Anesthesia Plan Comments:          Anesthesia Quick Evaluation

## 2024-09-02 NOTE — Anesthesia Postprocedure Evaluation (Signed)
 Anesthesia Post Note  Patient: Marc Horton  Procedure(s) Performed: EGD (ESOPHAGOGASTRODUODENOSCOPY)     Patient location during evaluation: PACU Anesthesia Type: General Level of consciousness: awake and alert Pain management: pain level controlled Vital Signs Assessment: post-procedure vital signs reviewed and stable Respiratory status: spontaneous breathing, nonlabored ventilation and respiratory function stable Cardiovascular status: blood pressure returned to baseline and stable Postop Assessment: no apparent nausea or vomiting Anesthetic complications: no   No notable events documented.  Last Vitals:  Vitals:   08/31/24 1800 08/31/24 1830  BP: 122/64 134/72  Pulse: 80 78  Resp: 18 18  Temp:    SpO2: 96% 97%    Last Pain:  Vitals:   08/31/24 1830  TempSrc:   PainSc: 2                  Gerod Caligiuri

## 2024-09-03 LAB — SURGICAL PATHOLOGY

## 2024-09-08 ENCOUNTER — Ambulatory Visit: Payer: Self-pay | Admitting: Gastroenterology

## 2024-09-29 ENCOUNTER — Encounter: Payer: Self-pay | Admitting: Gastroenterology

## 2024-09-29 ENCOUNTER — Ambulatory Visit: Admitting: Gastroenterology

## 2024-09-29 VITALS — BP 136/88 | HR 74 | Ht 72.0 in | Wt 248.1 lb

## 2024-09-29 DIAGNOSIS — T18128A Food in esophagus causing other injury, initial encounter: Secondary | ICD-10-CM

## 2024-09-29 DIAGNOSIS — R1319 Other dysphagia: Secondary | ICD-10-CM

## 2024-09-29 DIAGNOSIS — W44F3XA Food entering into or through a natural orifice, initial encounter: Secondary | ICD-10-CM | POA: Diagnosis not present

## 2024-09-29 DIAGNOSIS — R1310 Dysphagia, unspecified: Secondary | ICD-10-CM | POA: Diagnosis not present

## 2024-09-29 DIAGNOSIS — K222 Esophageal obstruction: Secondary | ICD-10-CM | POA: Diagnosis not present

## 2024-09-29 MED ORDER — PANTOPRAZOLE SODIUM 40 MG PO TBEC: 40.0000 mg | DELAYED_RELEASE_TABLET | Freq: Two times a day (BID) | ORAL | 2 refills | Status: AC

## 2024-09-29 NOTE — Progress Notes (Signed)
 "  Chief Complaint:eso stricture, eso dysphagia Primary GI Doctor: Dr. San  HPI:  Patient is a  58  year old male patient with no significant past medical history who presents for hospital follow-up for food impaction and dysphagia.  08/31/24 presented to ED with complaints of steak getting stuck in his esophagus. Patient had a similar episode in July 2025 and again in October both episodes were relieved with glucagon . No chronic GERD symptoms though he has had intermittent milder episodes of dysphagia or transient sensation of food sticking over the past year.  Interval History  Patient presents for follow-up after recent ED visit for food impaction. Accompanied by his wife.  Patient doing well since EGD. Patient has been taking Pantoprazole  40 mg twice daily and following mechanical soft diet.  He has lost 12lbs since starting the mechanical soft diet. History of GERD, no current issues. No current issues with dysphagia eating soft foods.   Patient denies altered bowel habits, abdominal pain, or rectal bleeding.  Never had colonoscopy.   Patient's family history includes: no esophageal CA, no colon CA   Wt Readings from Last 3 Encounters:  09/29/24 248 lb 2 oz (112.5 kg)  08/31/24 259 lb 14.8 oz (117.9 kg)  04/29/24 260 lb (117.9 kg)     Past Medical History:  Diagnosis Date   COVID    Hypercholesteremia    Pneumonia     Past Surgical History:  Procedure Laterality Date   APPENDECTOMY     ESOPHAGOGASTRODUODENOSCOPY N/A 08/31/2024   Procedure: EGD (ESOPHAGOGASTRODUODENOSCOPY);  Surgeon: San Sandor GAILS, DO;  Location: [facility] ENDOSCOPY;  Service: Gastroenterology;  Laterality: N/A;   KNEE ARTHROSCOPY Right     Current Outpatient Medications  Medication Sig Dispense Refill   pantoprazole  (PROTONIX ) 40 MG tablet Take 1 tablet (40 mg total) by mouth 2 (two) times daily. 180 tablet 2   sucralfate  (CARAFATE ) 1 GM/10ML suspension Take 10 mLs (1 g total) by mouth 4  (four) times daily. 420 mL 1   No current facility-administered medications for this visit.    Allergies as of 09/29/2024   (No Known Allergies)    Family History  Problem Relation Age of Onset   Diabetes Mother    Heart disease Mother    Heart failure Mother    Heart disease Father    Heart failure Father     Review of Systems:    Constitutional: No weight loss, fever, chills, weakness or fatigue HEENT: Eyes: No change in vision               Ears, Nose, Throat:  No change in hearing or congestion Skin: No rash or itching Cardiovascular: No chest pain, chest pressure or palpitations   Respiratory: No SOB or cough Gastrointestinal: See HPI and otherwise negative Genitourinary: No dysuria or change in urinary frequency Neurological: No headache, dizziness or syncope Musculoskeletal: No new muscle or joint pain Hematologic: No bleeding or bruising Psychiatric: No history of depression or anxiety    Physical Exam:  Vital signs: BP 136/88   Pulse 74   Ht 6' (1.829 m)   Wt 248 lb 2 oz (112.5 kg)   BMI 33.65 kg/m   Constitutional:   Pleasant  male appears to be in NAD, Well developed, Well nourished, alert and cooperative Eyes:   PEERL, EOMI. No icterus. Conjunctiva pink. Neck:  Supple Throat: Oral cavity and pharynx without inflammation, swelling or lesion.  Respiratory: Respirations even and unlabored. Lungs clear to auscultation bilaterally.  No wheezes, crackles, or rhonchi.  Cardiovascular: Normal S1, S2. Regular rate and rhythm. No peripheral edema, cyanosis or pallor.  Gastrointestinal:  Soft, nondistended, nontender. No rebound or guarding. Normal bowel sounds. No appreciable masses or hepatomegaly. Rectal:  Not performed.  Msk:  Symmetrical without gross deformities. Without edema, no deformity or joint abnormality.  Neurologic:  Alert and  oriented x4;  grossly normal neurologically.  Skin:   Dry and intact without significant lesions or rashes.  RELEVANT  LABS AND IMAGING: CBC    Latest Ref Rng & Units 08/31/2024   12:31 PM 04/29/2024   10:06 PM  CBC  WBC 4.0 - 10.5 K/uL 8.2  8.5   Hemoglobin 13.0 - 17.0 g/dL 83.4  84.0   Hematocrit 39.0 - 52.0 % 48.9  46.3   Platelets 150 - 400 K/uL 272  251      CMP     Latest Ref Rng & Units 08/31/2024   12:31 PM 04/29/2024   10:06 PM  CMP  Glucose 70 - 99 mg/dL 92  896   BUN 6 - 20 mg/dL 10  13   Creatinine 9.38 - 1.24 mg/dL 9.01  9.26   Sodium 864 - 145 mmol/L 140  141   Potassium 3.5 - 5.1 mmol/L 3.7  3.9   Chloride 98 - 111 mmol/L 103  105   CO2 22 - 32 mmol/L 24  22   Calcium 8.9 - 10.3 mg/dL 9.2  9.4   Total Protein 6.5 - 8.1 g/dL 7.4  7.0   Total Bilirubin 0.0 - 1.2 mg/dL 1.6  0.8   Alkaline Phos 38 - 126 U/L 82  91   AST 15 - 41 U/L 28  26   ALT 0 - 44 U/L 29  22     08/31/24 EGD at [facility] - Food in the lower third of the esophagus. Removal was successful. - Benign- appearing esophageal stenoses. - Normal stomach. - Normal examined duodenum. - At the conclusion of the procedure, biopsies were taken from the distal and proximal esophagus to evaluate for eosinophilic esophagitis. The distal esophageal biopsies were taken at a location proximal to the friable mucosa and proximal to the strictures. Path: A. ESOPHAGUS, DISTAL, BIOPSY:  - Squamous mucosa with spongiosis and changes compatible with reflux in  the appropriate clinical setting.  - Negative for intraepithelial eosinophils, dysplasia, and malignancy.  - Fragment of food debris.   B. ESOPHAGUS, PROXIMAL, BIOPSY:  - Squamous mucosa with spongiosis and changes compatible with reflux in  the appropriate clinical setting.  - Negative for intraepithelial eosinophils, dysplasia, and malignancy.    Assessment: Encounter Diagnoses  Name Primary?   Benign esophageal stricture Yes   Esophageal dysphagia    Esophageal obstruction due to food impaction     58 year old male patient who presents for follow-up for recent food  impaction and esophageal stricture.  Patient currently on mechanical soft diet and on pantoprazole  40 mg twice daily.  Patient doing well.  Patient scheduled for repeat upper endoscopy beginning of January to evaluate for mucosal healing as well as esophageal dilatation. 11/25 EGD with biopsy negative for EOE.   Patient has never had colon screening colonoscopy recommended colonoscopy versus Cologuard.  Patient would like to hold off for now and will discuss with his PCP.  Plan: -recommend colonoscopy, declined.  -recommended Cologuard as alternative, reports he will discuss with PCP -continue mechanical soft diet -Continue Protonix  ( pantoprazole ) 40 mg PO BID for 8 weeks to promote  mucosal healing, then reduce to 40 mg daily. (11/23-1/23) -Repeat upper endoscopy in 6 weeks to check healing. Will plan for esophageal stricture dilation at that time. Scheduled 1/7 in [facility] with Dr. San. The risks and benefits of EGD with possible biopsies and esophageal dilation were discussed with the patient who agrees to proceed.  Thank you for the courtesy of this consult. Please call me with any questions or concerns.   Lourdez Mcgahan, FNP-C [facility] 09/29/2024, 3:33 PM  Cc: Nena Cyndee LABOR, PA-C  "

## 2024-09-29 NOTE — Patient Instructions (Addendum)
" °  GERD Recommend GERD diet Continue Protonix  ( pantoprazole ) 40 mg PO BID for 8 weeks to promote mucosal healing (until Jan 23). Take 1 tab 30-45 mins before breakfast and dinner. Then continue Pantoprazole  40 mg po daily  Dysphagia Continue soft foods as tolerated   You have been scheduled for an endoscopy. Please follow written instructions given to you at your visit today.  If you use inhalers (even only as needed), please bring them with you on the day of your procedure.  If you take any of the following medications, they will need to be adjusted prior to your procedure:   DO NOT TAKE 7 DAYS PRIOR TO TEST- Trulicity (dulaglutide) Ozempic, Wegovy (semaglutide) Mounjaro, Zepbound (tirzepatide) Bydureon Bcise (exanatide extended release)  DO NOT TAKE 1 DAY PRIOR TO YOUR TEST Rybelsus (semaglutide) Adlyxin (lixisenatide) Victoza (liraglutide) Byetta (exanatide) ___________________________________________________________________________  Due to recent changes in healthcare laws, you may see the results of your imaging and laboratory studies on MyChart before your provider has had a chance to review them.  We understand that in some cases there may be results that are confusing or concerning to you. Not all laboratory results come back in the same time frame and the provider may be waiting for multiple results in order to interpret others.  Please give us  48 hours in order for your provider to thoroughly review all the results before contacting the office for clarification of your results.   _______________________________________________________  If your blood pressure at your visit was 140/90 or greater, please contact your primary care physician to follow up on this.  _______________________________________________________  If you are age 60 or older, your body mass index should be between 23-30. Your Body mass index is 33.65 kg/m. If this is out of the aforementioned range  listed, please consider follow up with your Primary Care Provider.  If you are age 31 or younger, your body mass index should be between 19-25. Your Body mass index is 33.65 kg/m. If this is out of the aformentioned range listed, please consider follow up with your Primary Care Provider.   ________________________________________________________  The [facility] providers would like to encourage you to use MYCHART to communicate with providers for non-urgent requests or questions.  Due to long hold times on the telephone, sending your provider a message by [facility] may be a faster and more efficient way to get a response.  Please allow 48 business hours for a response.  Please remember that this is for non-urgent requests.  _______________________________________________________  [facility] is using a team-based approach to care.  Your team is made up of your doctor and two to three APPS. Our APPS (Nurse Practitioners and Physician Assistants) work with your physician to ensure care continuity for you. They are fully qualified to address your health concerns and develop a treatment plan. They communicate directly with your gastroenterologist to care for you. Seeing the Advanced Practice Practitioners on your physician's team can help you by facilitating care more promptly, often allowing for earlier appointments, access to diagnostic testing, procedures, and other specialty referrals.   Thank you for trusting me with your gastrointestinal care. Deanna May, FNP-C  "

## 2024-10-06 NOTE — Progress Notes (Signed)
 Agree with the assessment and plan as outlined by [facility], FNP-C.  Carlitos Bottino, DO, [facility]

## 2024-10-15 ENCOUNTER — Other Ambulatory Visit: Payer: Self-pay | Admitting: Gastroenterology

## 2024-10-15 ENCOUNTER — Ambulatory Visit: Admitting: Gastroenterology

## 2024-10-15 ENCOUNTER — Encounter: Payer: Self-pay | Admitting: Gastroenterology

## 2024-10-15 VITALS — BP 112/72 | HR 68 | Temp 98.1°F | Resp 13 | Ht 72.0 in | Wt 248.0 lb

## 2024-10-15 DIAGNOSIS — K3189 Other diseases of stomach and duodenum: Secondary | ICD-10-CM | POA: Diagnosis not present

## 2024-10-15 DIAGNOSIS — K2900 Acute gastritis without bleeding: Secondary | ICD-10-CM | POA: Diagnosis not present

## 2024-10-15 DIAGNOSIS — K449 Diaphragmatic hernia without obstruction or gangrene: Secondary | ICD-10-CM | POA: Diagnosis not present

## 2024-10-15 DIAGNOSIS — K2970 Gastritis, unspecified, without bleeding: Secondary | ICD-10-CM

## 2024-10-15 DIAGNOSIS — R1310 Dysphagia, unspecified: Secondary | ICD-10-CM | POA: Diagnosis not present

## 2024-10-15 DIAGNOSIS — K222 Esophageal obstruction: Secondary | ICD-10-CM | POA: Diagnosis present

## 2024-10-15 DIAGNOSIS — R1319 Other dysphagia: Secondary | ICD-10-CM

## 2024-10-15 DIAGNOSIS — T18128A Food in esophagus causing other injury, initial encounter: Secondary | ICD-10-CM

## 2024-10-15 DIAGNOSIS — K2950 Unspecified chronic gastritis without bleeding: Secondary | ICD-10-CM

## 2024-10-15 MED ORDER — SODIUM CHLORIDE 0.9 % IV SOLN: 500.0000 mL | Freq: Once | INTRAVENOUS | Status: DC

## 2024-10-15 NOTE — Progress Notes (Signed)
 Called to room to assist during endoscopic procedure.  Patient ID and intended procedure confirmed with present staff. Received instructions for my participation in the procedure from the performing physician.

## 2024-10-15 NOTE — Patient Instructions (Signed)
 YOU HAD AN ENDOSCOPIC PROCEDURE TODAY AT THE [facility]:   Refer to the procedure report that was given to you for any specific questions about what was found during the examination.  If the procedure report does not answer your questions, please call your gastroenterologist to clarify.  If you requested that your care partner not be given the details of your procedure findings, then the procedure report has been included in a sealed envelope for you to review at your convenience later.  YOU SHOULD EXPECT: Some feelings of bloating in the abdomen. Passage of more gas than usual.  Walking can help get rid of the air that was put into your GI tract during the procedure and reduce the bloating. If you had a lower endoscopy (such as a colonoscopy or flexible sigmoidoscopy) you may notice spotting of blood in your stool or on the toilet paper. If you underwent a bowel prep for your procedure, you may not have a normal bowel movement for a few days.  Please Note:  You might notice some irritation and congestion in your nose or some drainage.  This is from the oxygen used during your procedure.  There is no need for concern and it should clear up in a day or so.  SYMPTOMS TO REPORT IMMEDIATELY:  Following upper endoscopy (EGD)  Vomiting of blood or coffee ground material  New chest pain or pain under the shoulder blades  Painful or persistently difficult swallowing  New shortness of breath  Fever of 100F or higher  Black, tarry-looking stools  For urgent or emergent issues, a gastroenterologist can be reached at any hour by calling [phone]. Do not use MyChart messaging for urgent concerns.    DIET:  We do recommend a soft diet today, but then you may proceed to your regular diet tomorrow as tolerated.  Drink plenty of fluids but you should avoid alcoholic beverages for 24 hours.  ACTIVITY:  You should plan to take it easy for the rest of today and you should NOT DRIVE or use  heavy machinery until tomorrow (because of the sedation medicines used during the test).    FOLLOW UP: Our staff will call the number listed on your records the next business day following your procedure.  We will call around 7:15- 8:00 am to check on you and address any questions or concerns that you may have regarding the information given to you following your procedure. If we do not reach you, we will leave a message.     If any biopsies were taken you will be contacted by phone or by letter within the next 1-3 weeks.  Please call us  at [phone] if you have not heard about the biopsies in 3 weeks.    SIGNATURES/CONFIDENTIALITY: You and/or your care partner have signed paperwork which will be entered into your electronic medical record.  These signatures attest to the fact that that the information above on your After Visit Summary has been reviewed and is understood.  Full responsibility of the confidentiality of this discharge information lies with you and/or your care-partner.

## 2024-10-15 NOTE — Progress Notes (Signed)
 Vss nad trans to pacu

## 2024-10-15 NOTE — Progress Notes (Signed)
 "   GASTROENTEROLOGY PROCEDURE H&P NOTE   Primary Care Physician: Nena Cyndee LABOR, PA-C    Reason for Procedure:  Dysphagia, esophageal stricture  Plan:    EGD  Patient is appropriate for endoscopic procedure(s) in the ambulatory ([facility]) setting.  The nature of the procedure, as well as the risks, benefits, and alternatives were carefully and thoroughly reviewed with the patient. Ample time for discussion and questions allowed. The patient understood, was satisfied, and agreed to proceed. I personally addressed all patient questions and concerns.     HPI: Marc Horton is a 59 y.o. male who presents for EGD for evaluation of dysphagia and follow-up from recent acute esophageal food impaction.  Has been having intermittent solid food dysphagia.  Went to the ER with food impaction in 04/2024 and 07/2024, relieved with glucagon .  Recurrent event on 08/31/2024 requiring endoscopic intervention with food retrieval.  Was started on high-dose Protonix  and presents today for repeat endoscopy for reevaluation and dilation.  Currently on mechanical soft diet and still on high-dose Protonix .  08/31/24 EGD at [facility] - Food in the lower third of the esophagus. Removal was successful. - Benign- appearing esophageal stenoses. - Normal stomach. - Normal examined duodenum. - At the conclusion of the procedure, biopsies were taken from the distal and proximal esophagus to evaluate for eosinophilic esophagitis. The distal esophageal biopsies were taken at a location proximal to the friable mucosa and proximal to the strictures. Path: A. ESOPHAGUS, DISTAL, BIOPSY:  - Squamous mucosa with spongiosis and changes compatible with reflux in  the appropriate clinical setting.  - Negative for intraepithelial eosinophils, dysplasia, and malignancy.  - Fragment of food debris.   B. ESOPHAGUS, PROXIMAL, BIOPSY:  - Squamous mucosa with spongiosis and changes compatible with reflux in  the appropriate  clinical setting.  - Negative for intraepithelial eosinophils, dysplasia, and malignancy.       Past Medical History:  Diagnosis Date   COVID    Hypercholesteremia    Pneumonia     Past Surgical History:  Procedure Laterality Date   APPENDECTOMY     ESOPHAGOGASTRODUODENOSCOPY N/A 08/31/2024   Procedure: EGD (ESOPHAGOGASTRODUODENOSCOPY);  Surgeon: San Sandor GAILS, DO;  Location: [facility] ENDOSCOPY;  Service: Gastroenterology;  Laterality: N/A;   KNEE ARTHROSCOPY Right     Prior to Admission medications  Medication Sig Start Date End Date Taking? Authorizing Provider  pantoprazole  (PROTONIX ) 40 MG tablet Take 1 tablet (40 mg total) by mouth 2 (two) times daily. 09/29/24 09/29/25 Yes May, Deanna J, NP  sucralfate  (CARAFATE ) 1 GM/10ML suspension Take 10 mLs (1 g total) by mouth 4 (four) times daily. Patient not taking: Reported on 10/15/2024 08/31/24 08/31/25  Ennio Houp V, DO    Current Outpatient Medications  Medication Sig Dispense Refill   pantoprazole  (PROTONIX ) 40 MG tablet Take 1 tablet (40 mg total) by mouth 2 (two) times daily. 180 tablet 2   sucralfate  (CARAFATE ) 1 GM/10ML suspension Take 10 mLs (1 g total) by mouth 4 (four) times daily. (Patient not taking: Reported on 10/15/2024) 420 mL 1   Current Facility-Administered Medications  Medication Dose Route Frequency Provider Last Rate Last Admin   0.9 %  sodium chloride  infusion  500 mL Intravenous Once Shomari Scicchitano V, DO        Allergies as of 10/15/2024   (No Known Allergies)    Family History  Problem Relation Age of Onset   Diabetes Mother    Heart disease Mother    Heart failure Mother  Heart disease Father    Heart failure Father    Esophageal cancer Neg Hx    Colon cancer Neg Hx    Stomach cancer Neg Hx    Rectal cancer Neg Hx     Social History   Socioeconomic History   Marital status: Single    Spouse name: Not on file   Number of children: Not on file   Years of education: Not on file    Highest education level: Not on file  Occupational History   Not on file  Tobacco Use   Smoking status: Never   Smokeless tobacco: Never  Vaping Use   Vaping status: Never Used  Substance and Sexual Activity   Alcohol use: No   Drug use: No   Sexual activity: Not Currently  Other Topics Concern   Not on file  Social History Narrative   ** Merged History Encounter **       Social Drivers of Health   Tobacco Use: Low Risk (10/15/2024)   Patient History    Smoking Tobacco Use: Never    Smokeless Tobacco Use: Never    Passive Exposure: Not on file  Financial Resource Strain: Not on file  Food Insecurity: Not on file  Transportation Needs: Not on file  Physical Activity: Not on file  Stress: Not on file  Social Connections: Not on file  Intimate Partner Violence: Not on file  Depression (EYV7-0): Not on file  Alcohol Screen: Not on file  Housing: Not on file  Utilities: Not on file  Health Literacy: Not on file    Physical Exam: Vital signs in last 24 hours: @BP  126/87   Pulse 71   Temp 98.1 F (36.7 C) (Skin)   Ht 6' (1.829 m)   Wt 248 lb (112.5 kg)   SpO2 98%   BMI 33.63 kg/m  GEN: NAD EYE: Sclerae anicteric ENT: MMM CV: Non-tachycardic Pulm: CTA b/l GI: Soft, NT/ND NEURO:  Alert & Oriented x 3   Sandor Flatter, DO [facility]   10/15/2024 9:51 AM  "

## 2024-10-15 NOTE — Op Note (Signed)
 [facility] Patient Name: Marc Horton Procedure Date: 10/15/2024 10:24 AM MRN: [id number] Endoscopist: Sandor Flatter , MD, [id number] Age: 59 Referring MD:  Date of Birth: 1966-07-03 Gender: Male Account #: [id number] Procedure:                Upper GI endoscopy Indications:              Dysphagia, For therapy of esophageal stricture Medicines:                Monitored Anesthesia Care Procedure:                Pre-Anesthesia Assessment:                           - Prior to the procedure, a History and Physical                            was performed, and patient medications and                            allergies were reviewed. The patient's tolerance of                            previous anesthesia was also reviewed. The risks                            and benefits of the procedure and the sedation                            options and risks were discussed with the patient.                            All questions were answered, and informed consent                            was obtained. Prior Anticoagulants: The patient has                            taken no anticoagulant or antiplatelet agents. ASA                            Grade Assessment: II - A patient with mild systemic                            disease. After reviewing the risks and benefits,                            the patient was deemed in satisfactory condition to                            undergo the procedure.                           After obtaining informed consent, the endoscope was  passed under direct vision. Throughout the                            procedure, the patient's blood pressure, pulse, and                            oxygen saturations were monitored continuously. The                            GIF [id number] #[id number] was introduced through the                            mouth, and advanced to the second part of duodenum.                            The  upper GI endoscopy was accomplished without                            difficulty. The patient tolerated the procedure                            well. Scope In: Scope Out: Findings:                 Two benign-appearing, intrinsic moderate stenoses                            were found 38 cm from the incisors. The stenoses                            were traversed. A TTS dilator was passed through                            the scope. Dilation with a 15-16.5-18 mm balloon                            dilator was performed to 18 mm. The dilation site                            was examined and showed mild mucosal disruption x2                            and moderate improvement in luminal narrowing.                            Biopsies were taken with a cold forceps for dual                            purposes of further fracturing of the rings and                            sent for histology. Estimated blood loss was  minimal.                           A 2 cm hiatal hernia was present.                           The upper third of the esophagus and middle third                            of the esophagus were normal. Additional biopsies                            were obtained from the upper esophagus.                           Localized mildly congested mucosa was found in the                            cardia. Biopsies were taken with a cold forceps for                            histology. Estimated blood loss was minimal.                           Scattered mild inflammation characterized by                            erythema was found in the gastric body and in the                            gastric antrum. Biopsies were taken with a cold                            forceps for Helicobacter pylori testing. Estimated                            blood loss was minimal.                           The examined duodenum was normal. Complications:            No  immediate complications. Estimated Blood Loss:     Estimated blood loss was minimal. Impression:               - Benign-appearing esophageal stenoses. Dilated                            with 18 mm TTS balloon with appropriate mucosal                            disruption consistent with successful dilation.                            This was then further fractured with cold forceps  and sent to pathology.                           - 2 cm hiatal hernia.                           - Normal upper third of esophagus and middle third                            of esophagus.                           - Congestive gastropathy. Biopsied.                           - Gastritis. Biopsied.                           - Normal examined duodenum. Recommendation:           - Patient has a contact number available for                            emergencies. The signs and symptoms of potential                            delayed complications were discussed with the                            patient. Return to normal activities tomorrow.                            Written discharge instructions were provided to the                            patient.                           - Soft diet today, then slowly advance as tolerated                            tomorrow.                           - Continue present medications.                           - Continue Protonix  40 mg twice daily for the next                            2 weeks, then can reduce to 40 mg daily.                           - Await pathology results.                           - Repeat upper endoscopy PRN for retreatment.                           -  Return to GI clinic in 3 months or sooner as                            needed. Sandor Flatter, MD 10/15/2024 10:51:50 AM

## 2024-10-16 ENCOUNTER — Telehealth: Payer: Self-pay | Admitting: *Deleted

## 2024-10-16 NOTE — Telephone Encounter (Signed)
 No answer for post procedure call back. Left VM.

## 2024-10-20 LAB — SURGICAL PATHOLOGY

## 2024-10-23 ENCOUNTER — Ambulatory Visit: Payer: Self-pay | Admitting: Gastroenterology

## 2024-12-31 ENCOUNTER — Ambulatory Visit: Admitting: Gastroenterology
